# Patient Record
Sex: Female | Born: 1990 | Race: Black or African American | Hispanic: No | Marital: Single | Smoking: Current every day smoker
Health system: Southern US, Community
[De-identification: ages and names within clinical notes are randomized; demographics above are authoritative.]

## PROBLEM LIST (undated history)

## (undated) DIAGNOSIS — J45909 Unspecified asthma, uncomplicated: Secondary | ICD-10-CM

## (undated) HISTORY — PX: ANKLE FRACTURE SURGERY: SHX122

---

## 2016-07-28 ENCOUNTER — Emergency Department
Admission: EM | Admit: 2016-07-28 | Discharge: 2016-07-28 | Disposition: A | Discharge status: Home / Self Care | Payer: Medicaid Other | Attending: Emergency Medicine | Admitting: Emergency Medicine

## 2016-07-28 ENCOUNTER — Encounter: Payer: Self-pay | Admitting: Emergency Medicine

## 2016-07-28 DIAGNOSIS — N764 Abscess of vulva: Secondary | ICD-10-CM | POA: Insufficient documentation

## 2016-07-28 DIAGNOSIS — F17200 Nicotine dependence, unspecified, uncomplicated: Secondary | ICD-10-CM | POA: Diagnosis not present

## 2016-07-28 DIAGNOSIS — L0291 Cutaneous abscess, unspecified: Secondary | ICD-10-CM

## 2016-07-28 DIAGNOSIS — J45909 Unspecified asthma, uncomplicated: Secondary | ICD-10-CM | POA: Insufficient documentation

## 2016-07-28 DIAGNOSIS — N762 Acute vulvitis: Secondary | ICD-10-CM

## 2016-07-28 HISTORY — DX: Unspecified asthma, uncomplicated: J45.909

## 2016-07-28 MED ORDER — HYDROCODONE-ACETAMINOPHEN 5-325 MG PO TABS
1.0000 | ORAL_TABLET | Freq: Once | ORAL | Status: AC
  Administered 2016-07-28: 1 via ORAL
  Filled 2016-07-28: qty 1

## 2016-07-28 MED ORDER — HYDROCODONE-ACETAMINOPHEN 5-325 MG PO TABS: 1.0000 | ORAL_TABLET | Freq: Four times a day (QID) | ORAL | 0 refills | Status: AC | PRN

## 2016-07-28 MED ORDER — SULFAMETHOXAZOLE-TRIMETHOPRIM 800-160 MG PO TABS: 1.0000 | ORAL_TABLET | Freq: Two times a day (BID) | ORAL | 0 refills | Status: DC

## 2016-07-28 NOTE — ED Triage Notes (Signed)
Pt to ED with c/o of abscess in vaginal area. Pt states has had them before but this time it is much worse.

## 2016-07-28 NOTE — ED Provider Notes (Signed)
[facility] Emergency Department Provider Note  ____________________________________________  Time seen: Approximately 2:56 PM  I have reviewed the triage vital signs and the nursing notes.   HISTORY  Chief Complaint Abscess    HPI Janice Ramsey is a 25 y.o. female , NAD, presents to the emergency department with three-day history of vaginal swelling. Patient states she noted a bump about the superior portion of her vaginal vault. States that his consistently grown in size and become more painful over the last 48 hours. Denies any oozing, weeping or bleeding. Area is not red but is significantly tender to palpation. States she has had other abscesses about her vulva but none inside the vagina. States she is sexually active with female partners only, but does use sexual toys during intercourse. States the toys are cleaned properly after use. No trauma or injury to the vagina. No vaginal discharge, pelvic pain, saddle paresthesias nor loss of bowel or bladder control. Denies dysuria, hematuria, abdominal pain, nausea or vomiting. Has had no fevers, chills, body aches. Denies chest pain or shortness of breath.   Past Medical History:  Diagnosis Date  . Asthma     There are no active problems to display for this patient.   History reviewed. No pertinent surgical history.  Prior to Admission medications   Medication Sig Start Date End Date Taking? Authorizing Provider  HYDROcodone-acetaminophen (NORCO) 5-325 MG tablet Take 1 tablet by mouth every 6 (six) hours as needed for severe pain. 07/28/16   Ginevra Tacker L Breeona Waid, PA-C  sulfamethoxazole-trimethoprim (BACTRIM DS,SEPTRA DS) 800-160 MG tablet Take 1 tablet by mouth 2 (two) times daily. 07/28/16   Sharissa Brierley L Malijah Lietz, PA-C    Allergies Review of patient's allergies indicates no known allergies.  History reviewed. No pertinent family history.  Social History Social History  Substance Use Topics  . Smoking status:  Current Every Day Smoker  . Smokeless tobacco: Never Used  . Alcohol use No     Review of Systems  Constitutional: No fever/chills Cardiovascular: No chest pain. Respiratory: No shortness of breath.  Gastrointestinal: No abdominal pain.  No nausea, vomiting.   Genitourinary: Negative for dysuria, hematuria, vaginal discharge. No urinary hesitancy, urgency or increased frequency. Musculoskeletal: Negative for back pain.  Skin: Positive abscess vagina. Negative for rash, skin sores, oozing, weeping, bleeding. Neurological: Negative for numbness, weakness, tingling. No saddle paresthesias or loss of bowel or bladder control. 10-point ROS otherwise negative.  ____________________________________________   PHYSICAL EXAM:  VITAL SIGNS: ED Triage Vitals [07/28/16 1243]  Enc Vitals Group     BP 123/84     Pulse Rate 82     Resp 16     Temp 98.3 F (36.8 C)     Temp Source Oral     SpO2 100 %     Weight 283 lb (128.4 kg)     Height 5\' 8"  (1.727 m)     Head Circumference      Peak Flow      Pain Score 10     Pain Loc      Pain Edu?      Excl. in GC?      Constitutional: Alert and oriented. Well appearing and in no acute distress. Eyes: Conjunctivae are normal. Head: Atraumatic. Hematological/Lymphatic/Immunilogical: No inguinal lymphadenopathy. Cardiovascular: Good peripheral circulation. Respiratory: Normal respiratory effort without tachypnea or retractions. Gastrointestinal: Soft and nontender without distention or guarding in all quadrants. Genitourinary:  Clitoris and clitoral hood are engorged to ~1.5-2cm in width with significant  tenderness to palpation. No active oozing, weeping or bleeding. Labia without swelling, skin sores or erythema. Vaginal vault without evidence of discharge. Neurologic:  Normal speech and language. No gross focal neurologic deficits are appreciated.  Skin:  Skin is warm, dry and intact. Psychiatric: Mood and affect are normal. Speech and  behavior are normal. Patient exhibits appropriate insight and judgement.   ____________________________________________   LABS  None ____________________________________________  EKG  None ____________________________________________  RADIOLOGY  None ____________________________________________    PROCEDURES  Procedure(s) performed: None   Procedures   Medications  HYDROcodone-acetaminophen (NORCO/VICODIN) 5-325 MG per tablet 1 tablet (1 tablet Oral Given 07/28/16 1532)     ____________________________________________   INITIAL IMPRESSION / ASSESSMENT AND PLAN / ED COURSE  Pertinent labs & imaging results that were available during my care of the patient were reviewed by me and considered in my medical decision making (see chart for details).  Clinical Course  Comment By Time  I spoke with Dr. Jean Rosenthal, OBGYN on call, in regards to the patient's history and physical exam. He suggests starting the patient on Bactrim DS to hopefully clear the infection and avoid an I&D. Patient will follow up with him in office Monday or Tuesday or return to the ED sooner if any worsening or new symptoms. All information was relayed to the patient and she agrees with the plan of care.  [name], PA-C 10/13 1530    Patient's diagnosis is consistent with Clitoral abscess. Patient will be discharged home with prescriptions for Bactrim DS and Norco to take as directed. Patient is to follow up with Dr. Jean Rosenthal an OB/GYN on Monday for further evaluation and treatment. Patient is to return to the emergency department if there is any worsening or onset of new symptoms.   ____________________________________________  FINAL CLINICAL IMPRESSION(S) / ED DIAGNOSES  Final diagnoses:  Inflammation of clitoris  Abscess      NEW MEDICATIONS STARTED DURING THIS VISIT:  Discharge Medication List as of 07/28/2016  3:45 PM    START taking these medications   Details   HYDROcodone-acetaminophen (NORCO) 5-325 MG tablet Take 1 tablet by mouth every 6 (six) hours as needed for severe pain., Starting Fri 07/28/2016, Print    sulfamethoxazole-trimethoprim (BACTRIM DS,SEPTRA DS) 800-160 MG tablet Take 1 tablet by mouth 2 (two) times daily., Starting Fri 07/28/2016, Print             Ernestene Kiel [place], PA-C 07/28/16 1605    Jennye Moccasin, MD 07/28/16 [phone]

## 2016-07-28 NOTE — ED Notes (Signed)
Pt has an abscess on the inside of vagina, pt noticed abscess 3 days ago, pt denies any other symptoms

## 2017-04-25 ENCOUNTER — Emergency Department
Admission: EM | Admit: 2017-04-25 | Discharge: 2017-04-25 | Disposition: A | Discharge status: Home / Self Care | Payer: Medicaid Other | Attending: Emergency Medicine | Admitting: Emergency Medicine

## 2017-04-25 ENCOUNTER — Encounter: Payer: Self-pay | Admitting: Emergency Medicine

## 2017-04-25 DIAGNOSIS — B3731 Acute candidiasis of vulva and vagina: Secondary | ICD-10-CM

## 2017-04-25 DIAGNOSIS — N760 Acute vaginitis: Secondary | ICD-10-CM | POA: Insufficient documentation

## 2017-04-25 DIAGNOSIS — F17200 Nicotine dependence, unspecified, uncomplicated: Secondary | ICD-10-CM | POA: Insufficient documentation

## 2017-04-25 DIAGNOSIS — J45909 Unspecified asthma, uncomplicated: Secondary | ICD-10-CM | POA: Diagnosis not present

## 2017-04-25 DIAGNOSIS — R102 Pelvic and perineal pain: Secondary | ICD-10-CM | POA: Diagnosis present

## 2017-04-25 DIAGNOSIS — B373 Candidiasis of vulva and vagina: Secondary | ICD-10-CM

## 2017-04-25 MED ORDER — CLOTRIMAZOLE 2 % VA CREA: 1.0000 | TOPICAL_CREAM | Freq: Every day | VAGINAL | 0 refills | Status: AC

## 2017-04-25 MED ORDER — FLUCONAZOLE 100 MG PO TABS
150.0000 mg | ORAL_TABLET | Freq: Once | ORAL | Status: AC
  Administered 2017-04-25: 150 mg via ORAL
  Filled 2017-04-25: qty 1

## 2017-04-25 MED ORDER — CLOTRIMAZOLE 2 % VA CREA: 1.0000 | TOPICAL_CREAM | Freq: Once | VAGINAL | Status: DC

## 2017-04-25 NOTE — ED Triage Notes (Signed)
Patient to ER for c/o vaginal pain and itching. Patient states she thought she was getting UTI, took antibiotic. Patient states afterwards, developed itching and pain to vaginal opening. Patient denies any discharge that she is aware of. Denies any fevers.

## 2017-04-25 NOTE — ED Provider Notes (Signed)
[facility] Emergency Department Provider Note   ____________________________________________   First MD Initiated Contact with Patient 04/25/17 [phone]     (approximate)  I have reviewed the triage vital signs and the nursing notes.   HISTORY  Chief Complaint Vaginal Itching    HPI Janice Ramsey is a 26 y.o. female who presents to the ED from home with a chief of vaginal pain and itching.Symptoms 2 days. Thought she was getting a UTI so took some leftover antibiotic. Subsequently developed itching and pain to her vagina. Denies associated vaginal bleeding, discharge or STD concerns. Does not have sex with men and reports sexual interactions with women does not involve touching her genitals. Denies fever, chills, chest pain, shortness of breath, abdominal pain, pelvic pain, nausea, vomiting, dysuria. Denies recent travel or trauma.   Past Medical History:  Diagnosis Date  . Asthma     There are no active problems to display for this patient.   History reviewed. No pertinent surgical history.  Prior to Admission medications   Medication Sig Start Date End Date Taking? Authorizing Provider  albuterol (PROVENTIL HFA;VENTOLIN HFA) 108 (90 Base) MCG/ACT inhaler Inhale 5 puffs into the lungs every 6 (six) hours as needed for wheezing. 05/14/16 05/14/17 Yes [provider]  clotrimazole (GYNE-LOTRIMIN 3) 2 % vaginal cream Place 1 Applicatorful vaginally at bedtime. 04/25/17 04/30/17  Irean HongSung, Hani Patnode J, MD  HYDROcodone-acetaminophen (NORCO) 5-325 MG tablet Take 1 tablet by mouth every 6 (six) hours as needed for severe pain. Patient not taking: Reported on 04/25/2017 07/28/16   Hagler, Jami L, PA-C    Allergies Patient has no known allergies.  No family history on file.  Social History Social History  Substance Use Topics  . Smoking status: Current Every Day Smoker  . Smokeless tobacco: Never Used  . Alcohol use No    Review of  Systems  Constitutional: No fever/chills. Eyes: No visual changes. ENT: No sore throat. Cardiovascular: Denies chest pain. Respiratory: Denies shortness of breath. Gastrointestinal: No abdominal pain.  No nausea, no vomiting.  No diarrhea.  No constipation. Genitourinary: Positive for vaginal itching and pain. Negative for dysuria. Musculoskeletal: Negative for back pain. Skin: Negative for rash. Neurological: Negative for headaches, focal weakness or numbness.   ____________________________________________   PHYSICAL EXAM:  VITAL SIGNS: ED Triage Vitals  Enc Vitals Group     BP 04/25/17 0313 120/78     Pulse Rate 04/25/17 0313 91     Resp 04/25/17 0313 20     Temp 04/25/17 0313 98.3 F (36.8 C)     Temp Source 04/25/17 0313 Oral     SpO2 04/25/17 0313 97 %     Weight 04/25/17 0314 285 lb (129.3 kg)     Height 04/25/17 0314 5\' 8"  (1.727 m)     Head Circumference --      Peak Flow --      Pain Score 04/25/17 0312 10     Pain Loc --      Pain Edu? --      Excl. in GC? --     Constitutional: Alert and oriented. Well appearing and in no acute distress. Eyes: Conjunctivae are normal. PERRL. EOMI. Head: Atraumatic. Nose: No congestion/rhinnorhea. Mouth/Throat: Mucous membranes are moist.  Oropharynx non-erythematous. Neck: No stridor.   Cardiovascular: Normal rate, regular rhythm. Grossly normal heart sounds.  Good peripheral circulation. Respiratory: Normal respiratory effort.  No retractions. Lungs CTAB. Gastrointestinal: Obese. Soft and nontender to light or deep palpation. No  distention. No abdominal bruits. No CVA tenderness. Genitourinary: External exam within normal limits without rashes, lesions or vesicles. Outer vaginal labia spread to reveal cheesy discharge. No swelling, bleeding or abrasions. Musculoskeletal: No lower extremity tenderness nor edema.  No joint effusions. Neurologic:  Normal speech and language. No gross focal neurologic deficits are  appreciated. No gait instability. Skin:  Skin is warm, dry and intact. No rash noted. Psychiatric: Mood and affect are normal. Speech and behavior are normal.  ____________________________________________   LABS (all labs ordered are listed, but only abnormal results are displayed)  Labs Reviewed  URINALYSIS, COMPLETE (UACMP) WITH MICROSCOPIC   ____________________________________________  EKG  None ____________________________________________  RADIOLOGY  No results found.  ____________________________________________   PROCEDURES  Procedure(s) performed: None  Procedures  Critical Care performed: No  ____________________________________________   INITIAL IMPRESSION / ASSESSMENT AND PLAN / ED COURSE  Pertinent labs & imaging results that were available during my care of the patient were reviewed by me and considered in my medical decision making (see chart for details).  26 year old female who presents with yeast vaginitis. Will administer Diflucan and also apply vaginal cream for comfort. Strict return precautions given. Patient verbalizes understanding and agrees with plan of care.      ____________________________________________   FINAL CLINICAL IMPRESSION(S) / ED DIAGNOSES  Final diagnoses:  Yeast vaginitis      NEW MEDICATIONS STARTED DURING THIS VISIT:  New Prescriptions   CLOTRIMAZOLE (GYNE-LOTRIMIN 3) 2 % VAGINAL CREAM    Place 1 Applicatorful vaginally at bedtime.     Note:  This document was prepared using Dragon voice recognition software and may include unintentional dictation errors.    Irean Hong, MD 04/25/17 [phone]

## 2017-04-25 NOTE — ED Notes (Signed)
Upon assessment pt reports itching in "vaginal area."

## 2017-04-25 NOTE — Discharge Instructions (Signed)
1. Apply vaginal cream nightly 5 nights. 2. Use cool compresses as needed for discomfort. 3. Return to the ER for worsening symptoms, persistent vomiting, difficulty breathing or other concerns.

## 2017-04-26 ENCOUNTER — Telehealth: Payer: Self-pay | Admitting: Emergency Medicine

## 2017-04-26 NOTE — Telephone Encounter (Signed)
Patient called because she continues to have vaginal irritation, and had some blood when she wiped today.  I advised her to call her pcp for advice on further treatments.  I told her she could always return here or go to urgent care if her doctor could not see her.

## 2017-08-06 ENCOUNTER — Encounter: Payer: Self-pay | Admitting: Emergency Medicine

## 2017-08-06 ENCOUNTER — Emergency Department
Admission: EM | Admit: 2017-08-06 | Discharge: 2017-08-06 | Disposition: A | Discharge status: Home / Self Care | Payer: Self-pay | Attending: Emergency Medicine | Admitting: Emergency Medicine

## 2017-08-06 DIAGNOSIS — F17200 Nicotine dependence, unspecified, uncomplicated: Secondary | ICD-10-CM | POA: Insufficient documentation

## 2017-08-06 DIAGNOSIS — N764 Abscess of vulva: Secondary | ICD-10-CM | POA: Insufficient documentation

## 2017-08-06 DIAGNOSIS — N762 Acute vulvitis: Secondary | ICD-10-CM

## 2017-08-06 DIAGNOSIS — J45909 Unspecified asthma, uncomplicated: Secondary | ICD-10-CM | POA: Insufficient documentation

## 2017-08-06 MED ORDER — SULFAMETHOXAZOLE-TRIMETHOPRIM 800-160 MG PO TABS: 1.0000 | ORAL_TABLET | Freq: Two times a day (BID) | ORAL | 0 refills | Status: AC

## 2017-08-06 MED ORDER — SULFAMETHOXAZOLE-TRIMETHOPRIM 800-160 MG PO TABS
1.0000 | ORAL_TABLET | Freq: Once | ORAL | Status: AC
  Administered 2017-08-06: 1 via ORAL
  Filled 2017-08-06: qty 1

## 2017-08-06 NOTE — ED Provider Notes (Signed)
[facility] Emergency Department Provider Note  ____________________________________________  Time seen: Approximately 9:36 PM  I have reviewed the triage vital signs and the nursing notes.   HISTORY  Chief Complaint Abscess    HPI Janice Ramsey is a 26 y.o. female who presents emergency department complaining of a "abscess" to the left labia. Patient reports that she gets "bumps" in the groin region frequently. Typically these are mildly painful, resolve on their own. The one today, has only worsened. She denies any drainage. She reports pain, swelling. No other complaints at this time. No abdominal pain, vaginal bleeding or discharge, dysuria, polyuria, hematuria. No medications prior to arrival. No other complaints at this time.   Past Medical History:  Diagnosis Date  . Asthma     There are no active problems to display for this patient.   History reviewed. No pertinent surgical history.  Prior to Admission medications   Medication Sig Start Date End Date Taking? Authorizing Provider  albuterol (PROVENTIL HFA;VENTOLIN HFA) 108 (90 Base) MCG/ACT inhaler Inhale 5 puffs into the lungs every 6 (six) hours as needed for wheezing. 05/14/16 05/14/17  [provider]  HYDROcodone-acetaminophen (NORCO) 5-325 MG tablet Take 1 tablet by mouth every 6 (six) hours as needed for severe pain. Patient not taking: Reported on 04/25/2017 07/28/16   Hagler, Jami L, PA-C  sulfamethoxazole-trimethoprim (BACTRIM DS,SEPTRA DS) 800-160 MG tablet Take 1 tablet by mouth 2 (two) times daily. 08/06/17   Cuthriell, Delorise RoyalsJonathan D, PA-C    Allergies Patient has no known allergies.  History reviewed. No pertinent family history.  Social History Social History  Substance Use Topics  . Smoking status: Current Every Day Smoker  . Smokeless tobacco: Never Used  . Alcohol use No     Review of Systems  Constitutional: No fever/chills Cardiovascular: no chest  pain. Respiratory: no cough. No SOB. Gastrointestinal: No abdominal pain.  No nausea, no vomiting.  No diarrhea.  No constipation. Genitourinary: Negative for dysuria. No hematuria Musculoskeletal: Negative for musculoskeletal pain. Skin: Negative for rash, abrasions, lacerations, ecchymosis. Positive for left labial abscess. Neurological: Negative for headaches, focal weakness or numbness. 10-point ROS otherwise negative.  ____________________________________________   PHYSICAL EXAM:  VITAL SIGNS: ED Triage Vitals  Enc Vitals Group     BP 08/06/17 2026 (!) 141/84     Pulse Rate 08/06/17 2026 90     Resp 08/06/17 2026 16     Temp 08/06/17 2026 98.9 F (37.2 C)     Temp src --      SpO2 08/06/17 2026 100 %     Weight 08/06/17 2023 285 lb (129.3 kg)     Height --      Head Circumference --      Peak Flow --      Pain Score --      Pain Loc --      Pain Edu? --      Excl. in GC? --      Constitutional: Alert and oriented. Well appearing and in no acute distress. Eyes: Conjunctivae are normal. PERRL. EOMI. Head: Atraumatic. Neck: No stridor.    Cardiovascular: Normal rate, regular rhythm. Normal S1 and S2.  Good peripheral circulation. Respiratory: Normal respiratory effort without tachypnea or retractions. Lungs CTAB. Good air entry to the bases with no decreased or absent breath sounds. Gastrointestinal: Bowel sounds 4 quadrants. Soft and nontender to palpation. No guarding or rigidity. No palpable masses. No distention. No CVA tenderness. Genitourinary: Visualization of the external genitalia  reveals erythema and edema to the left labia. Palpation reveals firmness, extreme tenderness. No fluctuance or induration. Musculoskeletal: Full range of motion to all extremities. No gross deformities appreciated. Neurologic:  Normal speech and language. No gross focal neurologic deficits are appreciated.  Skin:  Skin is warm, dry and intact. No rash noted. Psychiatric: Mood and  affect are normal. Speech and behavior are normal. Patient exhibits appropriate insight and judgement.  External genitalia exam is performed with female RN chaperone ____________________________________________   LABS (all labs ordered are listed, but only abnormal results are displayed)  Labs Reviewed - No data to display ____________________________________________  EKG   ____________________________________________  RADIOLOGY   No results found.  ____________________________________________    PROCEDURES  Procedure(s) performed:    Procedures    Medications  sulfamethoxazole-trimethoprim (BACTRIM DS,SEPTRA DS) 800-160 MG per tablet 1 tablet (not administered)     ____________________________________________   INITIAL IMPRESSION / ASSESSMENT AND PLAN / ED COURSE  Pertinent labs & imaging results that were available during my care of the patient were reviewed by me and considered in my medical decision making (see chart for details).  Review of the [place] CSRS was performed in accordance of the [facility] prior to dispensing any controlled drugs.     Patient's diagnosis is consistent with cellulitis to left labia. Differential included abscess versus cellulitis. Exam reveals no fluctuance or induration concerning for underlying abscess. No indication for labs, imaging, incision and drainage. Patient is treated with first dose of Bactrim in the emergency department.. Patient will be discharged home with prescriptions for Bactrim. Patient is to follow up with primary care or OB/GYN as needed or otherwise directed. Patient is given ED precautions to return to the ED for any worsening or new symptoms.     ____________________________________________  FINAL CLINICAL IMPRESSION(S) / ED DIAGNOSES  Final diagnoses:  Cellulitis of labia      NEW MEDICATIONS STARTED DURING THIS VISIT:  New Prescriptions   SULFAMETHOXAZOLE-TRIMETHOPRIM (BACTRIM DS,SEPTRA DS) 800-160  MG TABLET    Take 1 tablet by mouth 2 (two) times daily.        This chart was dictated using voice recognition software/Dragon. Despite best efforts to proofread, errors can occur which can change the meaning. Any change was purely unintentional.    Racheal Patches, PA-C 08/06/17 2144    Dionne Bucy, MD 08/06/17 2332

## 2017-08-06 NOTE — ED Triage Notes (Signed)
Pt c/o left sided raised area to the labia x3 days. Pt denies drainage but reports swelling, redness and 10/10 pain in area, worse when sitting down.

## 2017-08-06 NOTE — ED Notes (Signed)
See provider note for assessment

## 2017-11-19 ENCOUNTER — Emergency Department
Admission: EM | Admit: 2017-11-19 | Discharge: 2017-11-19 | Disposition: A | Discharge status: Home / Self Care | Payer: Self-pay | Attending: Emergency Medicine | Admitting: Emergency Medicine

## 2017-11-19 ENCOUNTER — Encounter: Payer: Self-pay | Admitting: Emergency Medicine

## 2017-11-19 DIAGNOSIS — J45909 Unspecified asthma, uncomplicated: Secondary | ICD-10-CM | POA: Insufficient documentation

## 2017-11-19 DIAGNOSIS — J111 Influenza due to unidentified influenza virus with other respiratory manifestations: Secondary | ICD-10-CM | POA: Insufficient documentation

## 2017-11-19 DIAGNOSIS — Z79899 Other long term (current) drug therapy: Secondary | ICD-10-CM | POA: Insufficient documentation

## 2017-11-19 DIAGNOSIS — F17200 Nicotine dependence, unspecified, uncomplicated: Secondary | ICD-10-CM | POA: Insufficient documentation

## 2017-11-19 LAB — INFLUENZA PANEL BY PCR (TYPE A & B)
INFLBPCR: NEGATIVE
Influenza A By PCR: POSITIVE — AB

## 2017-11-19 MED ORDER — ALBUTEROL SULFATE HFA 108 (90 BASE) MCG/ACT IN AERS: 2.0000 | INHALATION_SPRAY | Freq: Four times a day (QID) | RESPIRATORY_TRACT | 0 refills | Status: AC | PRN

## 2017-11-19 MED ORDER — OSELTAMIVIR PHOSPHATE 75 MG PO CAPS: 75.0000 mg | ORAL_CAPSULE | Freq: Two times a day (BID) | ORAL | 0 refills | Status: AC

## 2017-11-19 MED ORDER — ALBUTEROL SULFATE (2.5 MG/3ML) 0.083% IN NEBU: 2.5000 mg | INHALATION_SOLUTION | Freq: Four times a day (QID) | RESPIRATORY_TRACT | 12 refills | Status: AC | PRN

## 2017-11-19 MED ORDER — IPRATROPIUM-ALBUTEROL 0.5-2.5 (3) MG/3ML IN SOLN
3.0000 mL | Freq: Once | RESPIRATORY_TRACT | Status: AC
  Administered 2017-11-19: 3 mL via RESPIRATORY_TRACT
  Filled 2017-11-19: qty 3

## 2017-11-19 NOTE — ED Triage Notes (Signed)
Presents with body aches,cough and fever for couple of days

## 2017-11-19 NOTE — ED Provider Notes (Signed)
[facility] Emergency Department Provider Note  ____________________________________________  Time seen: Approximately 7:31 PM  I have reviewed the triage vital signs and the nursing notes.   HISTORY  Chief Complaint Generalized Body Aches    HPI Janice Ramsey is a 27 y.o. female presents emergency department for evaluation of body aches, nonproductive cough, shortness of breath for 2 days.  Patient has a history of asthma and has been using her albuterol inhaler.  Her inhaler is almost out.  She is unsure of fever.  No sick contacts. She is eating and drinking well. No headache, chest pain, nausea, vomiting, abdominal pain.  Past Medical History:  Diagnosis Date  . Asthma     There are no active problems to display for this patient.   History reviewed. No pertinent surgical history.  Prior to Admission medications   Medication Sig Start Date End Date Taking? Authorizing Provider  albuterol (PROVENTIL HFA;VENTOLIN HFA) 108 (90 Base) MCG/ACT inhaler Inhale 2 puffs into the lungs every 6 (six) hours as needed for wheezing or shortness of breath. 11/19/17   Enid DerryWagner, Laysa Kimmey, PA-C  albuterol (PROVENTIL) (2.5 MG/3ML) 0.083% nebulizer solution Take 3 mLs (2.5 mg total) by nebulization every 6 (six) hours as needed for wheezing or shortness of breath. 11/19/17   Enid DerryWagner, Arneta Mahmood, PA-C  HYDROcodone-acetaminophen (NORCO) 5-325 MG tablet Take 1 tablet by mouth every 6 (six) hours as needed for severe pain. Patient not taking: Reported on 04/25/2017 07/28/16   Hagler, Jami L, PA-C  oseltamivir (TAMIFLU) 75 MG capsule Take 1 capsule (75 mg total) by mouth 2 (two) times daily for 5 days. 11/19/17 11/24/17  Enid DerryWagner, Srija Southard, PA-C  sulfamethoxazole-trimethoprim (BACTRIM DS,SEPTRA DS) 800-160 MG tablet Take 1 tablet by mouth 2 (two) times daily. 08/06/17   Cuthriell, Delorise RoyalsJonathan D, PA-C    Allergies Patient has no known allergies.  No family history on file.  Social History Social  History   Tobacco Use  . Smoking status: Current Every Day Smoker  . Smokeless tobacco: Never Used  Substance Use Topics  . Alcohol use: No  . Drug use: No     Review of Systems  Constitutional: No fever/chills Cardiovascular: No chest pain. Gastrointestinal: No abdominal pain.  No nausea, no vomiting.  Musculoskeletal: Positive for body aches. Skin: Negative for rash, abrasions, lacerations, ecchymosis.   ____________________________________________   PHYSICAL EXAM:  VITAL SIGNS: ED Triage Vitals  Enc Vitals Group     BP 11/19/17 1444 104/62     Pulse Rate 11/19/17 1444 88     Resp 11/19/17 1444 18     Temp 11/19/17 1444 99 F (37.2 C)     Temp Source 11/19/17 1444 Oral     SpO2 11/19/17 1444 95 %     Weight 11/19/17 1439 265 lb (120.2 kg)     Height 11/19/17 1439 5\' 8"  (1.727 m)     Head Circumference --      Peak Flow --      Pain Score 11/19/17 1439 10     Pain Loc --      Pain Edu? --      Excl. in GC? --      Constitutional: Alert and oriented. Well appearing and in no acute distress. Eyes: Conjunctivae are normal. PERRL. EOMI. Head: Atraumatic. ENT:      Ears: Tympanic membranes are pearly.      Nose: No congestion/rhinnorhea.      Mouth/Throat: Mucous membranes are moist.  Neck: No stridor.   Cardiovascular:  Normal rate, regular rhythm.  Good peripheral circulation. Respiratory: Normal respiratory effort without tachypnea or retractions. Lungs CTAB. Good air entry to the bases with no decreased or absent breath sounds. Gastrointestinal: Bowel sounds 4 quadrants. Soft and nontender to palpation. No guarding or rigidity. No palpable masses. No distention.  Musculoskeletal: Full range of motion to all extremities. No gross deformities appreciated. Neurologic:  Normal speech and language. No gross focal neurologic deficits are appreciated.  Skin:  Skin is warm, dry and intact. No rash noted. Psychiatric: Mood and affect are normal. Speech and behavior  are normal. Patient exhibits appropriate insight and judgement.   ____________________________________________   LABS (all labs ordered are listed, but only abnormal results are displayed)  Labs Reviewed  INFLUENZA PANEL BY PCR (TYPE A & B) - Abnormal; Notable for the following components:      Result Value   Influenza A By PCR POSITIVE (*)    All other components within normal limits   ____________________________________________  EKG   ____________________________________________  RADIOLOGY   No results found.  ____________________________________________    PROCEDURES  Procedure(s) performed:    Procedures    Medications  ipratropium-albuterol (DUONEB) 0.5-2.5 (3) MG/3ML nebulizer solution 3 mL (3 mLs Nebulization Given 11/19/17 1628)     ____________________________________________   INITIAL IMPRESSION / ASSESSMENT AND PLAN / ED COURSE  Pertinent labs & imaging results that were available during my care of the patient were reviewed by me and considered in my medical decision making (see chart for details).  Review of the [place] CSRS was performed in accordance of the [facility] prior to dispensing any controlled drugs.     Patient's diagnosis is consistent with influenza A.  Vital signs and exam are reassuring.  Shortness of breath resolved with DuoNeb.  Patient will be discharged home with prescriptions for Tamiflu, albuterol inhaler, albuterol nebulizer. Patient is to follow up with  PCP as directed. Patient is given ED precautions to return to the ED for any worsening or new symptoms.     ____________________________________________  FINAL CLINICAL IMPRESSION(S) / ED DIAGNOSES  Final diagnoses:  Influenza  Asthma, unspecified asthma severity, unspecified whether complicated, unspecified whether persistent      NEW MEDICATIONS STARTED DURING THIS VISIT:  ED Discharge Orders        Ordered    DME Nebulizer machine     11/19/17 1649    albuterol  (PROVENTIL HFA;VENTOLIN HFA) 108 (90 Base) MCG/ACT inhaler  Every 6 hours PRN     11/19/17 1649    albuterol (PROVENTIL) (2.5 MG/3ML) 0.083% nebulizer solution  Every 6 hours PRN     11/19/17 1649    oseltamivir (TAMIFLU) 75 MG capsule  2 times daily     11/19/17 1649          This chart was dictated using voice recognition software/Dragon. Despite best efforts to proofread, errors can occur which can change the meaning. Any change was purely unintentional.    Enid Derry, PA-C 11/19/17 1952    Emily Filbert, MD 11/20/17 [phone]

## 2018-10-24 ENCOUNTER — Encounter (HOSPITAL_COMMUNITY): Payer: Self-pay | Admitting: Emergency Medicine

## 2018-10-24 ENCOUNTER — Other Ambulatory Visit: Payer: Self-pay

## 2018-10-24 ENCOUNTER — Emergency Department (HOSPITAL_COMMUNITY)
Admission: EM | Admit: 2018-10-24 | Discharge: 2018-10-24 | Disposition: A | Discharge status: Home / Self Care | Payer: Self-pay | Attending: Emergency Medicine | Admitting: Emergency Medicine

## 2018-10-24 ENCOUNTER — Emergency Department (HOSPITAL_COMMUNITY): Payer: Self-pay

## 2018-10-24 DIAGNOSIS — S62339A Displaced fracture of neck of unspecified metacarpal bone, initial encounter for closed fracture: Secondary | ICD-10-CM

## 2018-10-24 DIAGNOSIS — S62336A Displaced fracture of neck of fifth metacarpal bone, right hand, initial encounter for closed fracture: Secondary | ICD-10-CM | POA: Insufficient documentation

## 2018-10-24 DIAGNOSIS — Y999 Unspecified external cause status: Secondary | ICD-10-CM | POA: Insufficient documentation

## 2018-10-24 DIAGNOSIS — J45909 Unspecified asthma, uncomplicated: Secondary | ICD-10-CM | POA: Insufficient documentation

## 2018-10-24 DIAGNOSIS — Z79899 Other long term (current) drug therapy: Secondary | ICD-10-CM | POA: Insufficient documentation

## 2018-10-24 DIAGNOSIS — F17200 Nicotine dependence, unspecified, uncomplicated: Secondary | ICD-10-CM | POA: Insufficient documentation

## 2018-10-24 DIAGNOSIS — Y9389 Activity, other specified: Secondary | ICD-10-CM | POA: Insufficient documentation

## 2018-10-24 DIAGNOSIS — W2209XA Striking against other stationary object, initial encounter: Secondary | ICD-10-CM | POA: Insufficient documentation

## 2018-10-24 DIAGNOSIS — Y929 Unspecified place or not applicable: Secondary | ICD-10-CM | POA: Insufficient documentation

## 2018-10-24 NOTE — ED Triage Notes (Signed)
Patient presents with a hand injury s/p punching a wall. Patient states she has had a lot going on, got upset and hit the wall where the stud is. Patient's anterior and posterior hand noted to be bruised.

## 2018-10-24 NOTE — ED Provider Notes (Signed)
[facility]-EMERGENCY DEPT Provider Note   CSN: [id number] Arrival date & time: 10/24/18  2016     History   Chief Complaint Chief Complaint  Patient presents with  . Hand Injury    HPI Janice Ramsey is a 28 y.o. female possible history of asthma who presents for evaluation of continued right hand pain after she punched a wall approximately 4 days ago.  Patient reports that she was angry and upset at things going on and so she punched a wall.  She reports that she has had pain and swelling to the ulnar aspect of her right hand since then.  Patient reports she can move the hand without any difficulty but does report worsening pain when trying to move the pinky.  Patient denies any numbness/weakness.  The history is provided by the patient.    Past Medical History:  Diagnosis Date  . Asthma     There are no active problems to display for this patient.   History reviewed. No pertinent surgical history.   OB History   No obstetric history on file.      Home Medications    Prior to Admission medications   Medication Sig Start Date End Date Taking? Authorizing Provider  albuterol (PROVENTIL HFA;VENTOLIN HFA) 108 (90 Base) MCG/ACT inhaler Inhale 2 puffs into the lungs every 6 (six) hours as needed for wheezing or shortness of breath. 11/19/17   Enid Derry, PA-C  albuterol (PROVENTIL) (2.5 MG/3ML) 0.083% nebulizer solution Take 3 mLs (2.5 mg total) by nebulization every 6 (six) hours as needed for wheezing or shortness of breath. 11/19/17   Enid Derry, PA-C  HYDROcodone-acetaminophen (NORCO) 5-325 MG tablet Take 1 tablet by mouth every 6 (six) hours as needed for severe pain. Patient not taking: Reported on 04/25/2017 07/28/16   Hagler, Jami L, PA-C  sulfamethoxazole-trimethoprim (BACTRIM DS,SEPTRA DS) 800-160 MG tablet Take 1 tablet by mouth 2 (two) times daily. 08/06/17   Cuthriell, Delorise Royals, PA-C    Family History No family history on  file.  Social History Social History   Tobacco Use  . Smoking status: Current Every Day Smoker  . Smokeless tobacco: Never Used  Substance Use Topics  . Alcohol use: No  . Drug use: No     Allergies   Patient has no known allergies.   Review of Systems Review of Systems  Musculoskeletal:       Hand pain  Neurological: Negative for weakness and numbness.     Physical Exam Updated Vital Signs BP (!) 126/94 (BP Location: Left Arm)   Pulse 92   Temp 98.5 F (36.9 C) (Oral)   Resp 18   SpO2 100%   Physical Exam Vitals signs and nursing note reviewed.  Constitutional:      Appearance: She is well-developed.  HENT:     Head: Normocephalic and atraumatic.  Eyes:     General: No scleral icterus.       Right eye: No discharge.        Left eye: No discharge.     Conjunctiva/sclera: Conjunctivae normal.  Cardiovascular:     Pulses:          Radial pulses are 2+ on the right side and 2+ on the left side.  Pulmonary:     Effort: Pulmonary effort is normal.  Musculoskeletal:     Comments: Tenderness palpation noted to the third, fourth, fifth metacarpal.  There is some overlying soft tissue swelling noted to the dorsal aspect as  well as ecchymosis noted to the fourth and fifth metacarpal.  No tenderness palpation to phalanxes of right hand.  Patient can flex and extend all 5 digits without any difficulty.  She can easily make a fist.  Flexion/tension of wrist intact any difficulty.  No snuffbox tenderness.  No tenderness palpation noted to left hand.  Skin:    General: Skin is warm and dry.     Capillary Refill: Capillary refill takes less than 2 seconds.     Comments: Good distal cap refill. RUE is not dusky in appearance or cool to touch.  Neurological:     Mental Status: She is alert.     Comments: Sensation intact along major nerve distributions of RUE  Psychiatric:        Speech: Speech normal.        Behavior: Behavior normal.      ED Treatments / Results   Labs (all labs ordered are listed, but only abnormal results are displayed) Labs Reviewed - No data to display  EKG None  Radiology Dg Hand Complete Right  Result Date: 10/24/2018 CLINICAL DATA:  Bruising, punched a wall EXAM: RIGHT HAND - COMPLETE 3+ VIEW COMPARISON:  None. FINDINGS: Acute fracture distal shaft of the fifth metacarpal with mild volar angulation of distal fracture fragment. No subluxation. No radiopaque foreign body. IMPRESSION: Acute mildly angulated distal fifth metacarpal fracture Electronically Signed   By: Jasmine PangKim  Fujinaga M.D.   On: 10/24/2018 21:09    Procedures Procedures (including critical care time)  Medications Ordered in ED Medications - No data to display   Initial Impression / Assessment and Plan / ED Course  I have reviewed the triage vital signs and the nursing notes.  Pertinent labs & imaging results that were available during my care of the patient were reviewed by me and considered in my medical decision making (see chart for details).     28 year old female who presents for evaluation of right hand pain status post punching a wall 4 days ago. Patient is afebrile, non-toxic appearing, sitting comfortably on examination table. Vital signs reviewed and stable.  Patient is neurovascularly intact.  On exam, tenderness palpation in the third, fourth, fifth metacarpals.  Concern for fracture versus dislocation.  X-ray ordered at triage.  X-ray reviewed.  There is a acute mildly angulated distal fifth metacarpal fracture consistent with boxer's fracture.  Discussed results with patient.  Will patient in an ulnar gutter splint.  Reevaluation after splint placement.  Patient with good distal sensation and cap refill.  Instructed patient to follow-up with referred hand doctor for further evaluation. At this time, patient exhibits no emergent life-threatening condition that require further evaluation in ED or admission. Patient had ample opportunity for  questions and discussion. All patient's questions were answered with full understanding. Strict return precautions discussed. Patient expresses understanding and agreement to plan.   Portions of this note were generated with Scientist, clinical (histocompatibility and immunogenetics)Dragon dictation software. Dictation errors may occur despite best attempts at proofreading.   Final Clinical Impressions(s) / ED Diagnoses   Final diagnoses:  Closed boxer's fracture, initial encounter    ED Discharge Orders    None       Rosana HoesLayden,  A, PA-C 10/24/18 2209    Charlynne PanderYao, David Hsienta, MD 10/24/18 2248

## 2018-10-24 NOTE — Discharge Instructions (Signed)
You can take Tylenol or Ibuprofen as directed for pain. You can alternate Tylenol and Ibuprofen every 4 hours. If you take Tylenol at 1pm, then you can take Ibuprofen at 5pm. Then you can take Tylenol again at 9pm.   Keep the arm elevated at home.  Do not get the splint wet.  As we discussed, you will need to follow-up with referred hand doctor for further evaluation.  Return the emergency department for any worsening pain, redness or swelling of the hand, fever, numbness/weakness or any other worsening or concerning symptoms.

## 2019-05-22 IMAGING — CR DG HAND COMPLETE 3+V*R*
3 series · 3 of 3 positions shown · non-contrast
Comparison: None.

CLINICAL DATA: Bruising, punched a wall

EXAM:
RIGHT HAND - COMPLETE 3+ VIEW

[x hand pa right]
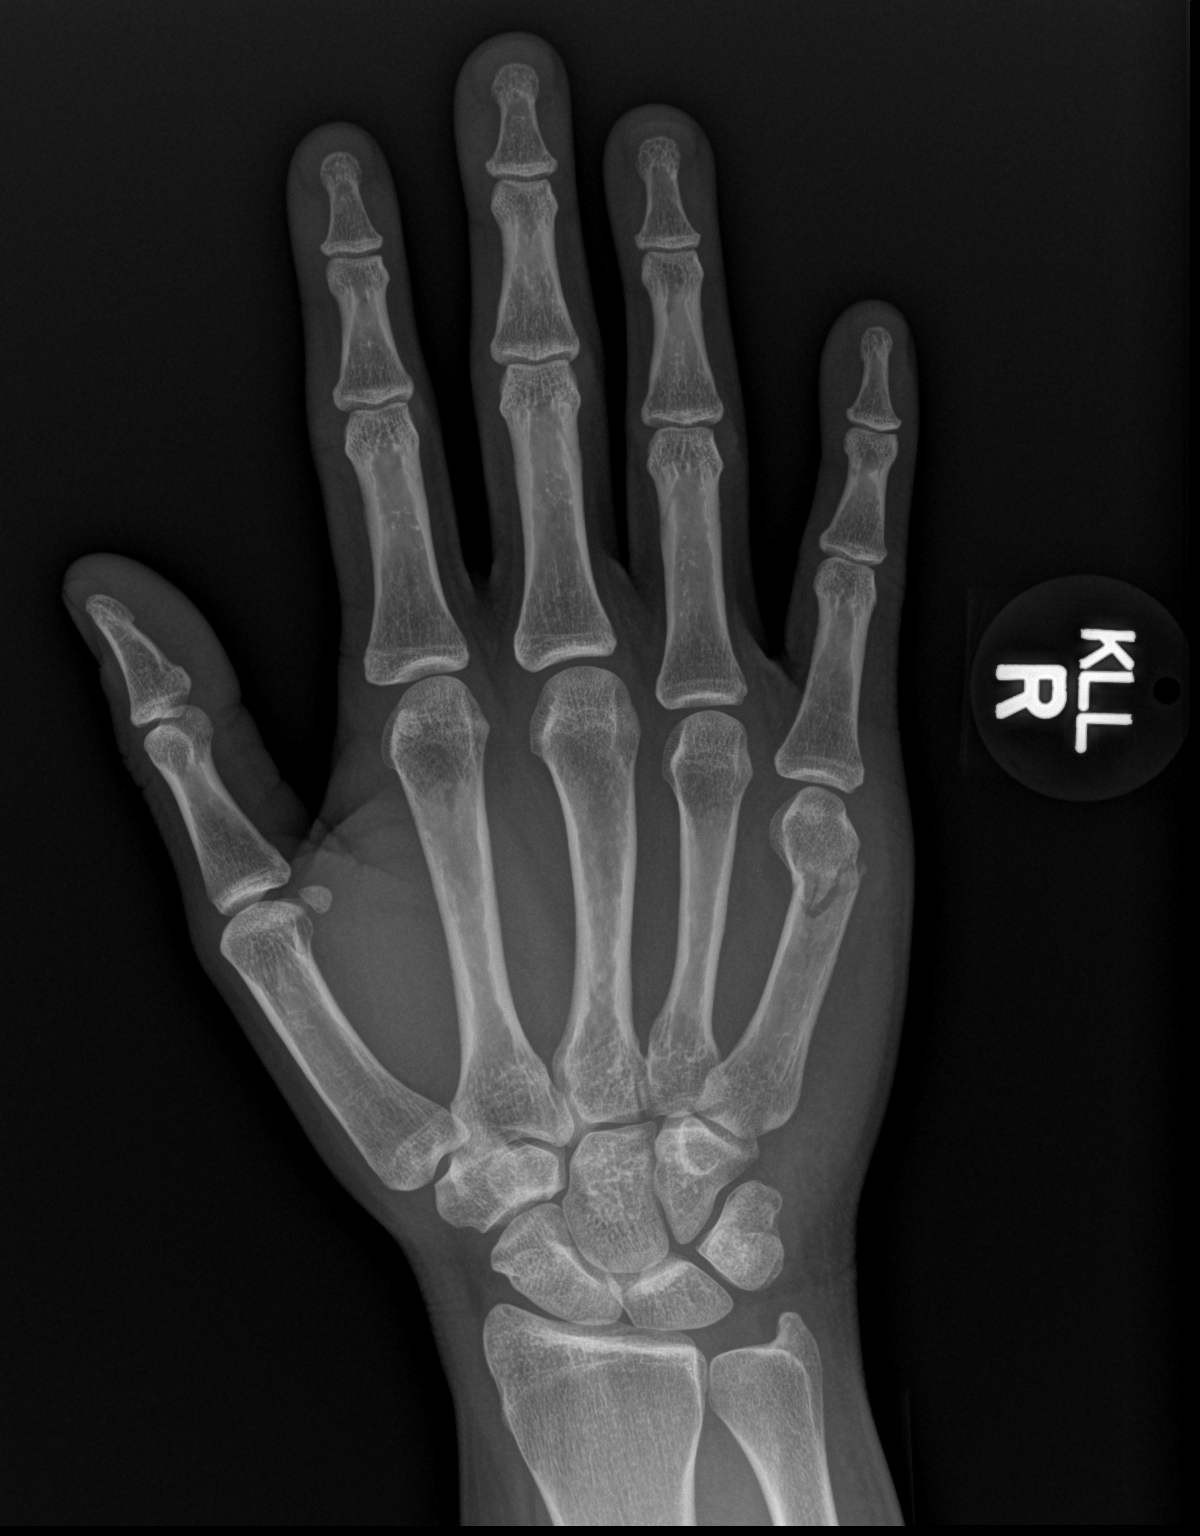

[x hand obl right]
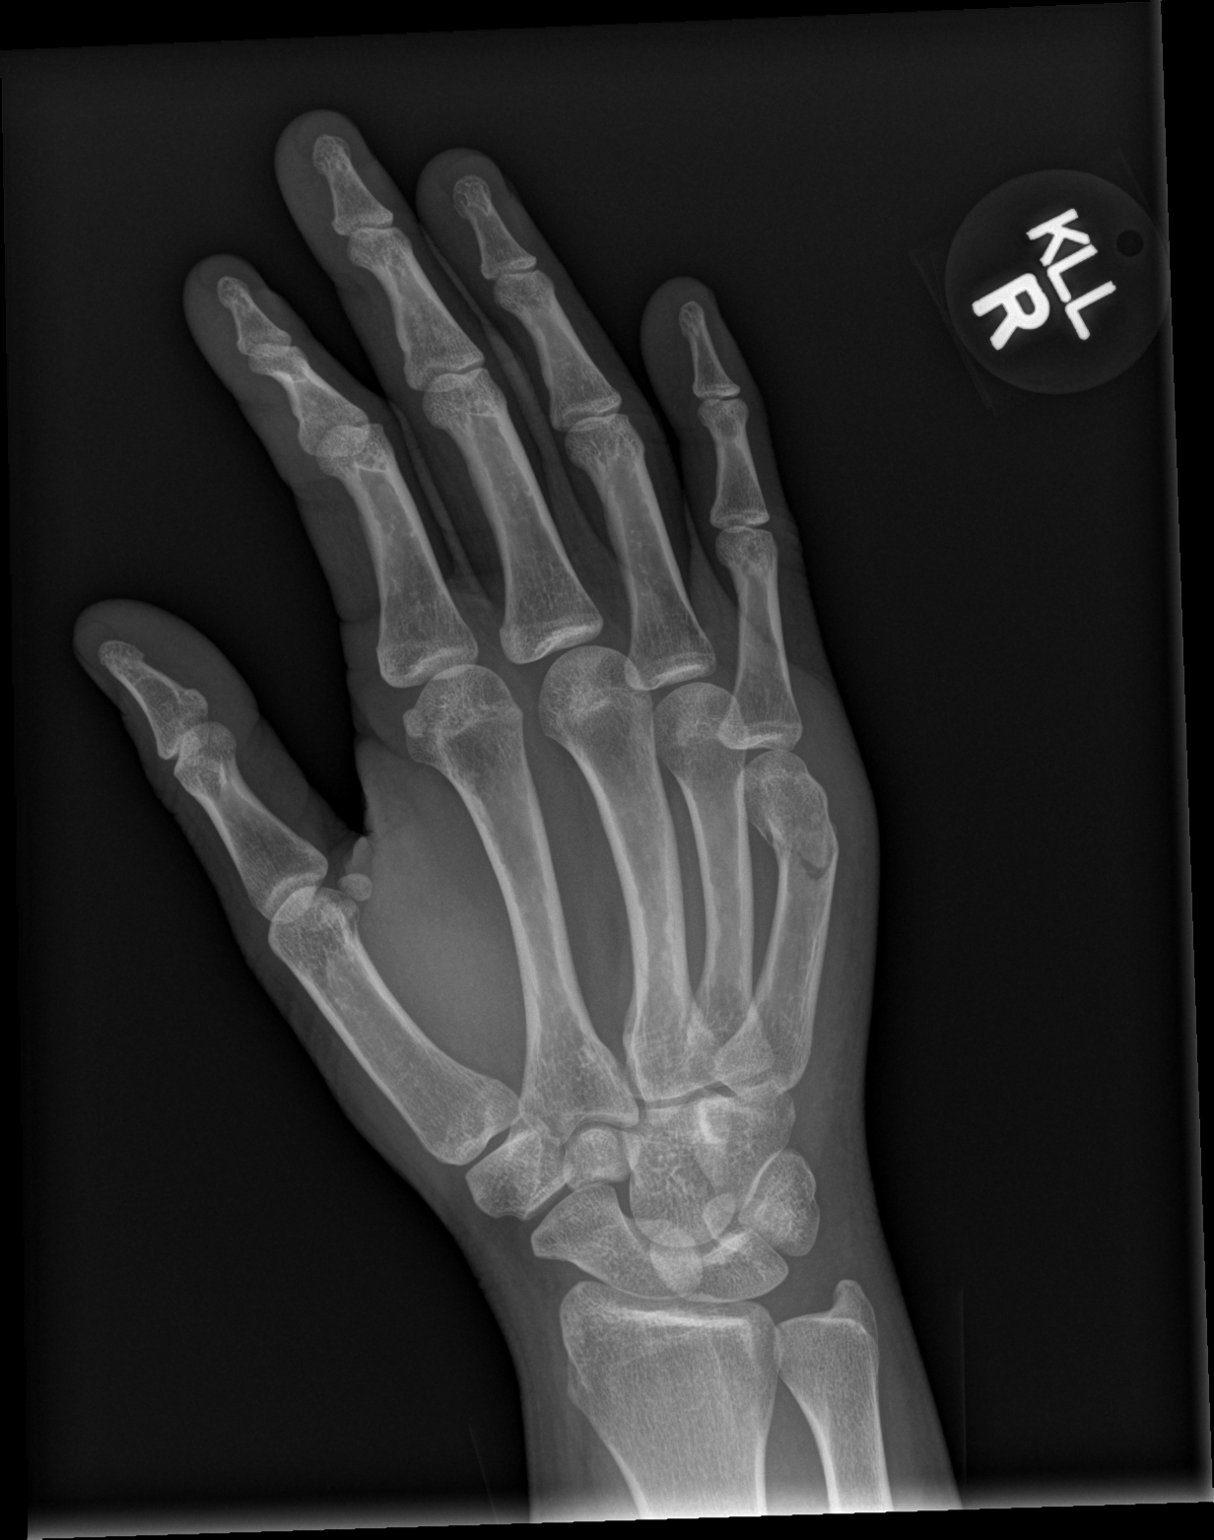

[x hand lat right]
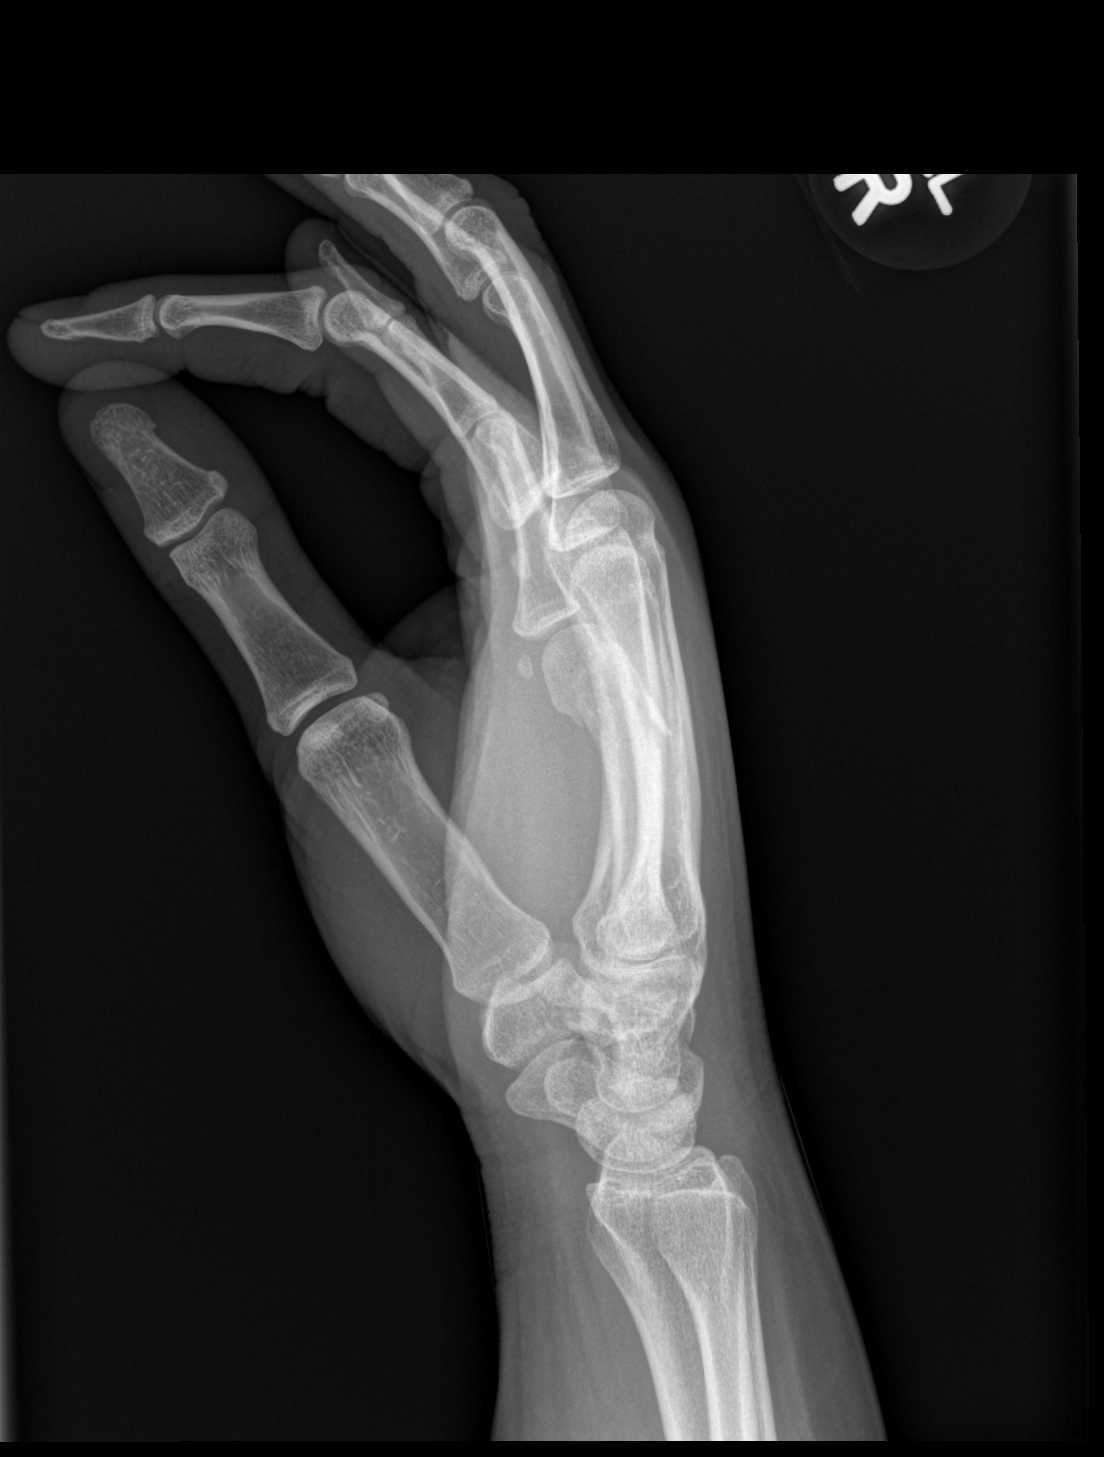

[3 of 3 positions shown; findings below may reference images not displayed]

FINDINGS: Acute fracture distal shaft of the fifth metacarpal with mild volar
angulation of distal fracture fragment. No subluxation. No
radiopaque foreign body.
IMPRESSION: Acute mildly angulated distal fifth metacarpal fracture

## 2021-03-03 ENCOUNTER — Ambulatory Visit (HOSPITAL_COMMUNITY): Payer: Medicaid Other

## 2021-03-04 ENCOUNTER — Ambulatory Visit (HOSPITAL_COMMUNITY)
Admission: RE | Admit: 2021-03-04 | Discharge: 2021-03-04 | Disposition: A | Discharge status: Home / Self Care | Payer: Self-pay | Source: Ambulatory Visit | Attending: Physician Assistant | Admitting: Physician Assistant

## 2021-03-04 ENCOUNTER — Other Ambulatory Visit: Payer: Self-pay

## 2021-03-04 ENCOUNTER — Encounter (HOSPITAL_COMMUNITY): Payer: Self-pay

## 2021-03-04 VITALS — BP 105/54 | HR 68 | Temp 97.7°F | Resp 19

## 2021-03-04 DIAGNOSIS — H6123 Impacted cerumen, bilateral: Secondary | ICD-10-CM

## 2021-03-04 DIAGNOSIS — H938X2 Other specified disorders of left ear: Secondary | ICD-10-CM

## 2021-03-04 NOTE — Discharge Instructions (Signed)
Ears improved with in office irrigation.  Use ceruminolytic drops such as Debrox daily to help manage symptoms.  Follow-up with ENT if symptoms persist.

## 2021-03-04 NOTE — ED Triage Notes (Signed)
Pt reports fulness sensation and hearing decreased in the left ear x 1 week. Pt used era wax removal drops x 2 days without relief. Denies pain.

## 2021-03-04 NOTE — ED Provider Notes (Signed)
[facility]    CSN: [id number] Arrival date & time: 03/04/21  3007      History   Chief Complaint Chief Complaint  Patient presents with  . Appointment    0900  . Ear Fullness    HPI Janice Ramsey is a 30 y.o. female.   Patient presents today with a several week history of left ear fullness.  Reports decreased hearing in this ear.  She does have a history of cerumen impaction and has been using over-the-counter ceruminolytic drops without improvement of symptoms.  She denies any recent swimming, airplane travel, diving.  She denies any otalgia, otorrhea, nausea, vomiting, headache, congestion, fever.  She does use Q-tips occasionally.  Denies any regular earbud or earplug use.  Has not seen ENT recently.  Denies any recent illness.  Denies any recent antibiotic use.     Past Medical History:  Diagnosis Date  . Asthma     There are no problems to display for this patient.   History reviewed. No pertinent surgical history.  OB History   No obstetric history on file.      Home Medications    Prior to Admission medications   Medication Sig Start Date End Date Taking? Authorizing Provider  albuterol (PROVENTIL HFA;VENTOLIN HFA) 108 (90 Base) MCG/ACT inhaler Inhale 2 puffs into the lungs every 6 (six) hours as needed for wheezing or shortness of breath. 11/19/17   Enid Derry, PA-C  albuterol (PROVENTIL) (2.5 MG/3ML) 0.083% nebulizer solution Take 3 mLs (2.5 mg total) by nebulization every 6 (six) hours as needed for wheezing or shortness of breath. 11/19/17   Enid Derry, PA-C  HYDROcodone-acetaminophen (NORCO) 5-325 MG tablet Take 1 tablet by mouth every 6 (six) hours as needed for severe pain. Patient not taking: No sig reported 07/28/16   Hagler, Jami L, PA-C  sulfamethoxazole-trimethoprim (BACTRIM DS,SEPTRA DS) 800-160 MG tablet Take 1 tablet by mouth 2 (two) times daily. 08/06/17   Cuthriell, Delorise Royals, PA-C    Family History History reviewed. No  pertinent family history.  Social History Social History   Tobacco Use  . Smoking status: Current Every Day Smoker  . Smokeless tobacco: Never Used  Substance Use Topics  . Alcohol use: No  . Drug use: No     Allergies   Patient has no known allergies.   Review of Systems Review of Systems  Constitutional: Negative for activity change, appetite change, fatigue and fever.  HENT: Positive for hearing loss. Negative for congestion, ear discharge, ear pain, sinus pressure, sneezing and sore throat.   Respiratory: Negative for cough and shortness of breath.   Cardiovascular: Negative for chest pain.  Gastrointestinal: Negative for abdominal pain, diarrhea, nausea and vomiting.  Neurological: Negative for dizziness, light-headedness and headaches.     Physical Exam Triage Vital Signs ED Triage Vitals  Enc Vitals Group     BP 03/04/21 0907 (!) 105/54     Pulse Rate 03/04/21 0907 68     Resp 03/04/21 0907 19     Temp 03/04/21 0907 97.7 F (36.5 C)     Temp Source 03/04/21 0907 Oral     SpO2 03/04/21 0907 100 %     Weight --      Height --      Head Circumference --      Peak Flow --      Pain Score 03/04/21 0906 0     Pain Loc --      Pain Edu? --  Excl. in GC? --    No data found.  Updated Vital Signs BP (!) 105/54 (BP Location: Right Arm)   Pulse 68   Temp 97.7 F (36.5 C) (Oral)   Resp 19   LMP  (Within Weeks) Comment: 2 weeks  SpO2 100%   Visual Acuity Right Eye Distance:   Left Eye Distance:   Bilateral Distance:    Right Eye Near:   Left Eye Near:    Bilateral Near:     Physical Exam Vitals reviewed.  Constitutional:      General: She is awake. She is not in acute distress.    Appearance: Normal appearance. She is not ill-appearing.     Comments: Very pleasant female appears at age in no acute distress  HENT:     Head: Normocephalic and atraumatic.     Right Ear: Tympanic membrane, ear canal and external ear normal. There is impacted  cerumen.     Left Ear: Tympanic membrane, ear canal and external ear normal. There is impacted cerumen.     Nose:     Right Sinus: No maxillary sinus tenderness or frontal sinus tenderness.     Left Sinus: No maxillary sinus tenderness or frontal sinus tenderness.     Mouth/Throat:     Pharynx: Uvula midline. No oropharyngeal exudate or posterior oropharyngeal erythema.  Cardiovascular:     Rate and Rhythm: Normal rate and regular rhythm.     Heart sounds: No murmur heard.   Pulmonary:     Effort: Pulmonary effort is normal.     Breath sounds: Normal breath sounds. No wheezing, rhonchi or rales.     Comments: Clear to auscultation bilaterally Lymphadenopathy:     Head:     Right side of head: No submental, submandibular or tonsillar adenopathy.     Left side of head: No submental, submandibular or tonsillar adenopathy.     Cervical: No cervical adenopathy.  Psychiatric:        Behavior: Behavior is cooperative.      UC Treatments / Results  Labs (all labs ordered are listed, but only abnormal results are displayed) Labs Reviewed - No data to display  EKG   Radiology No results found.  Procedures Procedures (including critical care time)  Medications Ordered in UC Medications - No data to display  Initial Impression / Assessment and Plan / UC Course  I have reviewed the triage vital signs and the nursing notes.  Pertinent labs & imaging results that were available during my care of the patient were reviewed by me and considered in my medical decision making (see chart for details).     Symptoms improved with an office irrigation.  Encourage patient to continue using ceruminolytic drops such as Debrox given history of cerumen impaction.  She was given ENT information to follow-up should symptoms recur.  Discouraged use of earplugs, earbuds, Q-tips.  Strict return precautions given to which patient expressed understanding.  Final Clinical Impressions(s) / UC  Diagnoses   Final diagnoses:  Bilateral impacted cerumen  Ear fullness, left     Discharge Instructions     Ears improved with in office irrigation.  Use ceruminolytic drops such as Debrox daily to help manage symptoms.  Follow-up with ENT if symptoms persist.    ED Prescriptions    None     PDMP not reviewed this encounter.   Jeani Hawking, PA-C 03/04/21 1004

## 2022-06-20 ENCOUNTER — Emergency Department (HOSPITAL_COMMUNITY)
Admission: EM | Admit: 2022-06-20 | Discharge: 2022-06-20 | Payer: Medicaid Other | Attending: Emergency Medicine | Admitting: Emergency Medicine

## 2022-06-20 ENCOUNTER — Emergency Department (HOSPITAL_COMMUNITY): Payer: Medicaid Other

## 2022-06-20 ENCOUNTER — Other Ambulatory Visit: Payer: Self-pay

## 2022-06-20 ENCOUNTER — Encounter (HOSPITAL_COMMUNITY): Payer: Self-pay

## 2022-06-20 DIAGNOSIS — Z7951 Long term (current) use of inhaled steroids: Secondary | ICD-10-CM | POA: Insufficient documentation

## 2022-06-20 DIAGNOSIS — S71112A Laceration without foreign body, left thigh, initial encounter: Secondary | ICD-10-CM | POA: Insufficient documentation

## 2022-06-20 DIAGNOSIS — W540XXA Bitten by dog, initial encounter: Secondary | ICD-10-CM | POA: Insufficient documentation

## 2022-06-20 DIAGNOSIS — J45909 Unspecified asthma, uncomplicated: Secondary | ICD-10-CM | POA: Insufficient documentation

## 2022-06-20 DIAGNOSIS — Z23 Encounter for immunization: Secondary | ICD-10-CM | POA: Insufficient documentation

## 2022-06-20 DIAGNOSIS — F17200 Nicotine dependence, unspecified, uncomplicated: Secondary | ICD-10-CM | POA: Insufficient documentation

## 2022-06-20 MED ORDER — TETANUS-DIPHTH-ACELL PERTUSSIS 5-2.5-18.5 LF-MCG/0.5 IM SUSY
0.5000 mL | PREFILLED_SYRINGE | Freq: Once | INTRAMUSCULAR | Status: AC
  Administered 2022-06-20: 0.5 mL via INTRAMUSCULAR
  Filled 2022-06-20: qty 0.5

## 2022-06-20 MED ORDER — AMOXICILLIN-POT CLAVULANATE 875-125 MG PO TABS: 1.0000 | ORAL_TABLET | Freq: Two times a day (BID) | ORAL | 0 refills | Status: DC

## 2022-06-20 MED ORDER — AMOXICILLIN-POT CLAVULANATE 875-125 MG PO TABS
1.0000 | ORAL_TABLET | Freq: Once | ORAL | Status: AC
  Administered 2022-06-20: 1 via ORAL
  Filled 2022-06-20: qty 1

## 2022-06-20 MED ORDER — HYDROCODONE-ACETAMINOPHEN 5-325 MG PO TABS
2.0000 | ORAL_TABLET | Freq: Once | ORAL | Status: AC
  Administered 2022-06-20: 2 via ORAL
  Filled 2022-06-20: qty 2

## 2022-06-20 MED ORDER — LIDOCAINE HCL (PF) 1 % IJ SOLN
30.0000 mL | Freq: Once | INTRAMUSCULAR | Status: AC
  Administered 2022-06-20: 30 mL
  Filled 2022-06-20: qty 30

## 2022-06-20 NOTE — Discharge Instructions (Addendum)
Please keep a very close eye on your lacerations.  Because it was caused by a dog, and you have a very high chance of getting an infection from these wounds.  We washed them out, and gave you antibiotics in the emergency department.  You need to take Augmentin (an antibiotic) twice a day for 7 days.  Please get your staples removed in around 7 to 10 days.  Please keep a very close eye on your wounds.  If you develop any redness, increasing pain, drainage of pus, fevers, or any other concerning symptoms, please return immediately to the emergency department for recheck.

## 2022-06-20 NOTE — ED Notes (Signed)
Pt has a large wound on inside of the L thigh with fatty tissue exposed. Several other wide wounds on L leg.

## 2022-06-20 NOTE — ED Notes (Signed)
Wounds irrigated with NS. 

## 2022-06-20 NOTE — ED Triage Notes (Signed)
Pt BIB [facility]. Pt was bit by [employer] dog on left leg. Pt ambulatory without assistance.

## 2022-06-21 NOTE — ED Provider Notes (Signed)
[facility]-EMERGENCY DEPT Provider Note  CSN: [id number] Arrival date & time: 06/20/22 1428  Chief Complaint(s) Animal Bite  HPI Janice Ramsey is a 31 y.o. female presenting to the emergency department after dog bite.  Patient reports that she was bitten multiple times by a dog.  Dog is a police dog and per police, fully vaccinated.  Patient denies numbness, tingling.  Reports pain at the sites of her wounds.  The wounds are all located on the left thigh.  No nausea, vomiting.  No fevers or chills.  Denies similar episodes in the past.  Unsure of last tetanus shot.   Past Medical History Past Medical History:  Diagnosis Date   Asthma    There are no problems to display for this patient.  Home Medication(s) Prior to Admission medications   Medication Sig Start Date End Date Taking? Authorizing Provider  amoxicillin-clavulanate (AUGMENTIN) 875-125 MG tablet Take 1 tablet by mouth every 12 (twelve) hours. 06/20/22  Yes Lonell Grandchild, MD  albuterol (PROVENTIL HFA;VENTOLIN HFA) 108 (90 Base) MCG/ACT inhaler Inhale 2 puffs into the lungs every 6 (six) hours as needed for wheezing or shortness of breath. 11/19/17   Enid Derry, PA-C  albuterol (PROVENTIL) (2.5 MG/3ML) 0.083% nebulizer solution Take 3 mLs (2.5 mg total) by nebulization every 6 (six) hours as needed for wheezing or shortness of breath. 11/19/17   Enid Derry, PA-C  HYDROcodone-acetaminophen (NORCO) 5-325 MG tablet Take 1 tablet by mouth every 6 (six) hours as needed for severe pain. Patient not taking: No sig reported 07/28/16   Hagler, Jami L, PA-C  sulfamethoxazole-trimethoprim (BACTRIM DS,SEPTRA DS) 800-160 MG tablet Take 1 tablet by mouth 2 (two) times daily. 08/06/17   Cuthriell, Delorise Royals, PA-C                                                                                                                                    Past Surgical History History reviewed. No pertinent surgical  history. Family History History reviewed. No pertinent family history.  Social History Social History   Tobacco Use   Smoking status: Every Day   Smokeless tobacco: Never  Substance Use Topics   Alcohol use: No   Drug use: No   Allergies Patient has no known allergies.  Review of Systems Review of Systems  All other systems reviewed and are negative.   Physical Exam Vital Signs  I have reviewed the triage vital signs BP 108/85   Pulse 79   Temp 99 F (37.2 C) (Oral)   Resp 20   LMP 06/16/2022   SpO2 96%  Physical Exam Vitals and nursing note reviewed.  Constitutional:      General: She is not in acute distress.    Appearance: She is well-developed.  HENT:     Head: Normocephalic and atraumatic.     Mouth/Throat:     Mouth: Mucous membranes are moist.  Eyes:  Pupils: Pupils are equal, round, and reactive to light.  Cardiovascular:     Rate and Rhythm: Normal rate and regular rhythm.     Heart sounds: No murmur heard. Pulmonary:     Effort: Pulmonary effort is normal. No respiratory distress.     Breath sounds: Normal breath sounds.  Abdominal:     General: Abdomen is flat.     Palpations: Abdomen is soft.     Tenderness: There is no abdominal tenderness.  Musculoskeletal:        General: No tenderness.     Right lower leg: No edema.     Left lower leg: No edema.  Skin:    General: Skin is warm and dry.     Comments: Numerous wounds to the left thigh, including 3.5 cm wound to the left anterior thigh, 5 cm wound to the left anterior thigh, 5 cm wound to the left medial thigh, 1 cm wound to the left medial thigh, 1 cm wound to the left posterior thigh, 1 cm wound to the left posterior thigh.  These wounds have exposed fat, explored wound to wound base with no evidence of foreign body, no exposed muscle or fascia.  Patient also with scattered other superficial lacerations and abrasions on the left side.  Remainder of skin exam without sign of wound.   Neurological:     General: No focal deficit present.     Mental Status: She is alert. Mental status is at baseline.  Psychiatric:        Mood and Affect: Mood normal.        Behavior: Behavior normal.     ED Results and Treatments Labs (all labs ordered are listed, but only abnormal results are displayed) Labs Reviewed - No data to display                                                                                                                        Radiology DG Femur Min 2 Views Left  Result Date: 06/20/2022 CLINICAL DATA:  Dog bite. EXAM: LEFT FEMUR 2 VIEWS COMPARISON:  None Available. FINDINGS: Cortical margins of the femur are intact. There is no evidence of fracture or other focal bone lesions. The included soft tissues are unremarkable. Suspected soft tissue edema about the lateral distal thigh, only partially included in the field of view. No radiopaque foreign body. IMPRESSION: Soft tissue edema. No radiopaque foreign body or acute osseous abnormality. Electronically Signed   By: Narda Rutherford M.D.   On: 06/20/2022 17:18   DG Hip Unilat With Pelvis 2-3 Views Left  Result Date: 06/20/2022 CLINICAL DATA:  Dog bite EXAM: DG HIP (WITH OR WITHOUT PELVIS) 2-3V LEFT COMPARISON:  None Available. FINDINGS: No fracture or dislocation is seen. There are no opaque foreign bodies. Phleboliths are seen in pelvis. IMPRESSION: No radiographic abnormality is seen in pelvis and left hip. Electronically Signed   By: Ernie Avena M.D.   On: 06/20/2022 17:17    Pertinent  labs & imaging results that were available during my care of the patient were reviewed by me and considered in my medical decision making (see MDM for details).  Medications Ordered in ED Medications  HYDROcodone-acetaminophen (NORCO/VICODIN) 5-325 MG per tablet 2 tablet (2 tablets Oral Given 06/20/22 1555)  Tdap (BOOSTRIX) injection 0.5 mL (0.5 mLs Intramuscular Given 06/20/22 1616)  lidocaine (PF) (XYLOCAINE) 1 %  injection 30 mL (30 mLs Infiltration Given by Other 06/20/22 1908)  amoxicillin-clavulanate (AUGMENTIN) 875-125 MG per tablet 1 tablet (1 tablet Oral Given 06/20/22 1555)                                                                                                                                     Procedures .Marland KitchenLaceration Repair  Date/Time: 06/21/2022 11:25 AM  Performed by: Lonell Grandchild, MD Authorized by: Lonell Grandchild, MD   Consent:    Consent obtained:  Verbal   Consent given by:  Patient   Risks, benefits, and alternatives were discussed: yes     Risks discussed:  Infection, need for additional repair, nerve damage, poor wound healing, poor cosmetic result, pain, retained foreign body, tendon damage and vascular damage   Alternatives discussed:  No treatment, observation, delayed treatment and referral Universal protocol:    Procedure explained and questions answered to patient or proxy's satisfaction: yes     Patient identity confirmed:  Verbally with patient and arm band Anesthesia:    Anesthesia method:  Local infiltration   Local anesthetic:  Lidocaine 1% w/o epi Laceration details:    Location:  Leg   Leg location:  L upper leg (anterior thigh)   Length (cm):  3.5 Exploration:    Limited defect created (wound extended): no     Hemostasis achieved with:  Direct pressure   Imaging outcome: foreign body not noted     Wound exploration: wound explored through full range of motion and entire depth of wound visualized     Wound extent: no areolar tissue violation noted, no fascia violation noted, no foreign bodies/material noted, no muscle damage noted, no nerve damage noted, no tendon damage noted, no underlying fracture noted and no vascular damage noted     Contaminated: no   Treatment:    Area cleansed with:  Saline   Amount of cleaning:  Extensive   Irrigation solution:  Sterile saline   Irrigation method:  Syringe   Visualized foreign bodies/material removed:  no     Debridement:  None   Undermining:  None   Scar revision: no   Skin repair:    Repair method:  Staples   Number of staples:  5 Approximation:    Approximation:  Close Repair type:    Repair type:  Simple Post-procedure details:    Procedure completion:  Tolerated well, no immediate complications .Marland KitchenLaceration Repair  Date/Time: 06/21/2022 11:27 AM  Performed by: Lonell Grandchild, MD Authorized by: Alvino Blood  L, MD   Consent:    Consent obtained:  Verbal   Consent given by:  Patient   Risks, benefits, and alternatives were discussed: yes     Risks discussed:  Infection, need for additional repair, nerve damage, poor wound healing, poor cosmetic result, retained foreign body, tendon damage, pain and vascular damage   Alternatives discussed:  No treatment, delayed treatment, referral and observation Universal protocol:    Procedure explained and questions answered to patient or proxy's satisfaction: yes     Patient identity confirmed:  Verbally with patient and arm band Anesthesia:    Anesthesia method:  Local infiltration   Local anesthetic:  Lidocaine 1% w/o epi Laceration details:    Location:  Leg   Leg location:  L upper leg (L anterior thigh)   Wound length (cm): 5. Pre-procedure details:    Preparation:  Imaging obtained to evaluate for foreign bodies Exploration:    Limited defect created (wound extended): no     Imaging obtained: x-ray     Imaging outcome: foreign body not noted     Wound exploration: wound explored through full range of motion and entire depth of wound visualized     Wound extent: no areolar tissue violation noted, no fascia violation noted, no foreign bodies/material noted, no muscle damage noted, no nerve damage noted, no tendon damage noted, no underlying fracture noted and no vascular damage noted     Contaminated: no   Treatment:    Area cleansed with:  Saline   Amount of cleaning:  Standard   Irrigation method:  Syringe    Visualized foreign bodies/material removed: no     Debridement:  None   Scar revision: no   Skin repair:    Repair method:  Staples   Number of staples:  5 Approximation:    Approximation:  Close Repair type:    Repair type:  Simple .Marland KitchenLaceration Repair  Date/Time: 06/21/2022 11:28 AM  Performed by: Lonell Grandchild, MD Authorized by: Lonell Grandchild, MD   Consent:    Consent obtained:  Verbal   Consent given by:  Patient   Risks, benefits, and alternatives were discussed: yes     Risks discussed:  Infection, need for additional repair, nerve damage, poor wound healing, poor cosmetic result, retained foreign body, tendon damage, vascular damage and pain   Alternatives discussed:  No treatment, delayed treatment, observation and referral Universal protocol:    Procedure explained and questions answered to patient or proxy's satisfaction: yes     Patient identity confirmed:  Verbally with patient and arm band Anesthesia:    Anesthesia method:  None Laceration details:    Location:  Leg   Leg location:  L upper leg (medial thigh)   Length (cm):  5 Pre-procedure details:    Preparation:  Imaging obtained to evaluate for foreign bodies Exploration:    Limited defect created (wound extended): no     Imaging obtained: x-ray     Imaging outcome: foreign body not noted     Wound exploration: wound explored through full range of motion and entire depth of wound visualized     Wound extent: no areolar tissue violation noted, no fascia violation noted, no foreign bodies/material noted, no muscle damage noted, no nerve damage noted, no tendon damage noted, no underlying fracture noted and no vascular damage noted     Contaminated: no   Treatment:    Area cleansed with:  Saline   Amount of cleaning:  Extensive  Irrigation solution:  Sterile saline   Irrigation method:  Syringe   Visualized foreign bodies/material removed: no     Debridement:  None   Undermining:  None   Scar  revision: no   Skin repair:    Repair method:  Staples   Number of staples:  7 Approximation:    Approximation:  Close Repair type:    Repair type:  Simple Post-procedure details:    Procedure completion:  Tolerated well, no immediate complications .Marland KitchenLaceration Repair  Date/Time: 06/21/2022 11:30 AM  Performed by: Lonell Grandchild, MD Authorized by: Lonell Grandchild, MD   Consent:    Consent obtained:  Verbal   Consent given by:  Patient   Risks, benefits, and alternatives were discussed: yes     Risks discussed:  Infection, nerve damage, need for additional repair, pain, poor cosmetic result, poor wound healing, vascular damage, tendon damage and retained foreign body   Alternatives discussed:  No treatment Universal protocol:    Procedure explained and questions answered to patient or proxy's satisfaction: yes     Patient identity confirmed:  Verbally with patient and arm band Anesthesia:    Anesthesia method:  Local infiltration   Local anesthetic:  Lidocaine 1% w/o epi Laceration details:    Location:  Leg   Leg location:  L upper leg   Length (cm):  1 Pre-procedure details:    Preparation:  Imaging obtained to evaluate for foreign bodies Exploration:    Limited defect created (wound extended): no     Imaging obtained: x-ray     Imaging outcome: foreign body not noted     Wound exploration: wound explored through full range of motion and entire depth of wound visualized     Wound extent: no areolar tissue violation noted, no fascia violation noted, no foreign bodies/material noted, no muscle damage noted, no nerve damage noted, no tendon damage noted, no underlying fracture noted and no vascular damage noted     Contaminated: no   Treatment:    Area cleansed with:  Saline   Amount of cleaning:  Extensive   Irrigation solution:  Sterile saline   Irrigation method:  Syringe   Visualized foreign bodies/material removed: no     Debridement:  None   Undermining:   None   Scar revision: no   Skin repair:    Repair method:  Staples   Number of staples:  2 Approximation:    Approximation:  Close Repair type:    Repair type:  Simple Post-procedure details:    Procedure completion:  Tolerated well, no immediate complications .Marland KitchenLaceration Repair  Date/Time: 06/21/2022 11:31 AM  Performed by: Lonell Grandchild, MD Authorized by: Lonell Grandchild, MD   Consent:    Consent obtained:  Verbal   Consent given by:  Patient   Risks, benefits, and alternatives were discussed: yes     Risks discussed:  Infection, need for additional repair, nerve damage, poor wound healing, poor cosmetic result, pain, retained foreign body, tendon damage and vascular damage   Alternatives discussed:  No treatment Universal protocol:    Procedure explained and questions answered to patient or proxy's satisfaction: yes     Patient identity confirmed:  Verbally with patient and arm band Anesthesia:    Anesthesia method:  Local infiltration   Local anesthetic:  Lidocaine 1% w/o epi Laceration details:    Location:  Leg   Leg location:  L upper leg (posterior thigh)   Length (cm):  2 Pre-procedure details:  Preparation:  Imaging obtained to evaluate for foreign bodies Exploration:    Limited defect created (wound extended): no     Imaging obtained: x-ray     Imaging outcome: foreign body not noted     Wound exploration: wound explored through full range of motion and entire depth of wound visualized     Wound extent: no areolar tissue violation noted, no fascia violation noted, no foreign bodies/material noted, no muscle damage noted, no nerve damage noted, no tendon damage noted, no underlying fracture noted and no vascular damage noted     Contaminated: no   Treatment:    Area cleansed with:  Saline   Amount of cleaning:  Standard   Irrigation solution:  Sterile saline   Irrigation method:  Syringe   Visualized foreign bodies/material removed: no      Debridement:  None   Undermining:  None   Scar revision: no   Skin repair:    Repair method:  Staples   Number of staples:  2 Approximation:    Approximation:  Close Repair type:    Repair type:  Simple Post-procedure details:    Procedure completion:  Tolerated well, no immediate complications .Marland KitchenLaceration Repair  Date/Time: 06/21/2022 11:33 AM  Performed by: Lonell Grandchild, MD Authorized by: Lonell Grandchild, MD   Consent:    Consent obtained:  Verbal   Consent given by:  Patient   Risks, benefits, and alternatives were discussed: yes     Risks discussed:  Infection, need for additional repair, nerve damage, poor wound healing, poor cosmetic result, pain, retained foreign body, tendon damage and vascular damage   Alternatives discussed:  No treatment, delayed treatment, referral and observation Universal protocol:    Procedure explained and questions answered to patient or proxy's satisfaction: yes     Patient identity confirmed:  Verbally with patient and arm band Anesthesia:    Anesthesia method:  Local infiltration   Local anesthetic:  Lidocaine 1% w/o epi Laceration details:    Location:  Leg   Leg location:  L upper leg (left posterior thigh)   Length (cm):  1 Pre-procedure details:    Preparation:  Imaging obtained to evaluate for foreign bodies Exploration:    Limited defect created (wound extended): no     Imaging obtained: x-ray     Imaging outcome: foreign body not noted     Wound exploration: wound explored through full range of motion and entire depth of wound visualized     Wound extent: no areolar tissue violation noted, no fascia violation noted, no foreign bodies/material noted, no muscle damage noted, no nerve damage noted, no tendon damage noted, no underlying fracture noted and no vascular damage noted   Treatment:    Area cleansed with:  Saline   Amount of cleaning:  Extensive   Irrigation solution:  Sterile saline   Irrigation method:   Syringe   Visualized foreign bodies/material removed: no     Debridement:  None   Undermining:  None   Scar revision: no   Skin repair:    Repair method:  Staples   Number of staples:  1 Approximation:    Approximation:  Close Repair type:    Repair type:  Simple Post-procedure details:    Procedure completion:  Tolerated well, no immediate complications   (including critical care time)  Medical Decision Making / ED Course   MDM:  31 year old female presenting after dog bite injury.  X-rays obtained without evidence of foreign body.  Tetanus updated.  Pain controlled.  Repaired 6 separate lacerations.  Discussed with patient monitoring and leaving open versus repair.  Given exposed fat, patient would benefit from closure. Wounds extensively irrigated. Patient prescribed augmentin and dose given in emergency department. Discussed very strict return precautions and high risk of infection with the patient. Will discharge patient to care of police, with copy of aftercare instructions for care in jail. All questions answered. Patient comfortable with plan of discharge. Return precautions discussed with patient and specified on the after visit summary.      Additional history obtained: -Additional history obtained from police -External records from outside source obtained and reviewed including: Chart review including previous notes, labs, imaging, consultation notes   Lab Tests: -I ordered, reviewed, and interpreted labs.   The pertinent results include:   Labs Reviewed - No data to display    EKG   EKG Interpretation  Date/Time:    Ventricular Rate:    PR Interval:    QRS Duration:   QT Interval:    QTC Calculation:   R Axis:     Text Interpretation:           Imaging Studies ordered: I ordered imaging studies including XR femur/pelvis On my interpretation imaging demonstrates no foreign body I independently visualized and interpreted imaging. I agree with  the radiologist interpretation   Medicines ordered and prescription drug management: Meds ordered this encounter  Medications   HYDROcodone-acetaminophen (NORCO/VICODIN) 5-325 MG per tablet 2 tablet   Tdap (BOOSTRIX) injection 0.5 mL   lidocaine (PF) (XYLOCAINE) 1 % injection 30 mL   amoxicillin-clavulanate (AUGMENTIN) 875-125 MG per tablet 1 tablet   amoxicillin-clavulanate (AUGMENTIN) 875-125 MG tablet    Sig: Take 1 tablet by mouth every 12 (twelve) hours.    Dispense:  14 tablet    Refill:  0    -I have reviewed the patients home medicines and have made adjustments as needed    Cardiac Monitoring: The patient was maintained on a cardiac monitor.  I personally viewed and interpreted the cardiac monitored which showed an underlying rhythm of: NSR  Social Determinants of Health:  Factors impacting patients care include: obesity   Reevaluation: After the interventions noted above, I reevaluated the patient and found that they have improved  Co morbidities that complicate the patient evaluation  Past Medical History:  Diagnosis Date   Asthma       Dispostion: Discharge    Final Clinical Impression(s) / ED Diagnoses Final diagnoses:  Dog bite, initial encounter  Laceration of skin of left thigh, initial encounter     This chart was dictated using voice recognition software.  Despite best efforts to proofread,  errors can occur which can change the documentation meaning.    Lonell GrandchildScheving, Evin Loiseau L, MD 06/21/22 1139

## 2022-06-26 NOTE — ED Notes (Signed)
Opened chart to provide work note 

## 2022-06-29 ENCOUNTER — Emergency Department (HOSPITAL_COMMUNITY)
Admission: EM | Admit: 2022-06-29 | Discharge: 2022-06-29 | Disposition: A | Discharge status: Home / Self Care | Payer: Medicaid Other | Attending: Emergency Medicine | Admitting: Emergency Medicine

## 2022-06-29 ENCOUNTER — Encounter (HOSPITAL_COMMUNITY): Payer: Self-pay | Admitting: Emergency Medicine

## 2022-06-29 DIAGNOSIS — J45909 Unspecified asthma, uncomplicated: Secondary | ICD-10-CM | POA: Insufficient documentation

## 2022-06-29 DIAGNOSIS — Z4800 Encounter for change or removal of nonsurgical wound dressing: Secondary | ICD-10-CM | POA: Insufficient documentation

## 2022-06-29 DIAGNOSIS — Z5189 Encounter for other specified aftercare: Secondary | ICD-10-CM

## 2022-06-29 MED ORDER — CLINDAMYCIN HCL 150 MG PO CAPS: 450.0000 mg | ORAL_CAPSULE | Freq: Three times a day (TID) | ORAL | 0 refills | Status: AC

## 2022-06-29 NOTE — Discharge Instructions (Addendum)
You were seen in the emergency department for a wound check. We removed 2 staples to allow the wound to open and drain. I am placing you on a new antibiotic, Clindamycin that you will take every 8 hours over the next week. Please return in about 3 days for a recheck of your wound. Return sooner for fever or significantly increased pus-like drainage from the area.

## 2022-06-29 NOTE — ED Triage Notes (Signed)
Patient reports she needs sutures removed from L leg placed on 9/5. States yellow drainage from one site.

## 2022-06-29 NOTE — ED Provider Notes (Signed)
[facility]-EMERGENCY DEPT Provider Note   CSN: [id number] Arrival date & time: 06/29/22  1124     History  Chief Complaint  Patient presents with   Suture / Staple Removal    Janice Ramsey is a 31 y.o. female.  With past medical history of asthma who presents to the emergency department for wound recheck.  Patient states that she was seen on 06/20/2022 after having dog bite to the left thigh.  Imaging was negative.  She had multiple lacerations that were closed with staples.  She was placed on Augmentin and had her tetanus shot updated.  States that a few days ago she began having drainage and foul odor from the posterior left thigh wound.  She denies having fever, nausea.  States she has been taking her antibiotics as prescribed.   Suture / Staple Removal       Home Medications Prior to Admission medications   Medication Sig Start Date End Date Taking? Authorizing Provider  clindamycin (CLEOCIN) 150 MG capsule Take 3 capsules (450 mg total) by mouth 3 (three) times daily for 7 days. 06/29/22 07/06/22 Yes Cristopher Peru, PA-C  albuterol (PROVENTIL HFA;VENTOLIN HFA) 108 (90 Base) MCG/ACT inhaler Inhale 2 puffs into the lungs every 6 (six) hours as needed for wheezing or shortness of breath. 11/19/17   Enid Derry, PA-C  albuterol (PROVENTIL) (2.5 MG/3ML) 0.083% nebulizer solution Take 3 mLs (2.5 mg total) by nebulization every 6 (six) hours as needed for wheezing or shortness of breath. 11/19/17   Enid Derry, PA-C  HYDROcodone-acetaminophen (NORCO) 5-325 MG tablet Take 1 tablet by mouth every 6 (six) hours as needed for severe pain. Patient not taking: No sig reported 07/28/16   Hagler, Jami L, PA-C  sulfamethoxazole-trimethoprim (BACTRIM DS,SEPTRA DS) 800-160 MG tablet Take 1 tablet by mouth 2 (two) times daily. 08/06/17   Cuthriell, Delorise Royals, PA-C      Allergies    Patient has no known allergies.    Review of Systems   Review of Systems  Skin:   Positive for wound.  All other systems reviewed and are negative.   Physical Exam Updated Vital Signs BP 130/82   Pulse 81   Temp 98.6 F (37 C) (Oral)   Resp 18   LMP 06/16/2022   SpO2 97%  Physical Exam Vitals and nursing note reviewed.  Constitutional:      General: She is not in acute distress.    Appearance: Normal appearance. She is obese. She is not ill-appearing or toxic-appearing.  HENT:     Head: Normocephalic and atraumatic.  Eyes:     General: No scleral icterus. Pulmonary:     Effort: Pulmonary effort is normal. No respiratory distress.  Skin:    Capillary Refill: Capillary refill takes less than 2 seconds.     Findings: Erythema present. No rash.     Comments: Left posterior thigh with 2 to 3 cm wound that is stapled.  There is surrounding redness and the wound appears to have some purulent drainage.  Other wounds appear well-healing  Neurological:     General: No focal deficit present.     Mental Status: She is alert and oriented to person, place, and time.  Psychiatric:        Mood and Affect: Mood normal.        Behavior: Behavior normal.        Thought Content: Thought content normal.        Judgment: Judgment normal.  ED Results / Procedures / Treatments   Labs (all labs ordered are listed, but only abnormal results are displayed) Labs Reviewed - No data to display  EKG None  Radiology No results found.  Procedures .Suture Removal  Date/Time: 06/29/2022 5:08 PM  Performed by: Cristopher Peru, PA-C Authorized by: Cristopher Peru, PA-C   Consent:    Consent obtained:  Verbal   Consent given by:  Patient   Risks, benefits, and alternatives were discussed: yes     Risks discussed:  Bleeding, pain and wound separation   Alternatives discussed:  No treatment, delayed treatment and alternative treatment Universal protocol:    Procedure explained and questions answered to patient or proxy's satisfaction: yes     Relevant documents  present and verified: yes     Test results available: yes     Imaging studies available: yes     Required blood products, implants, devices, and special equipment available: yes     Site/side marked: yes     Immediately prior to procedure, a time out was called: yes     Patient identity confirmed:  Verbally with patient Location:    Location:  Lower extremity   Lower extremity location:  Leg   Leg location:  L upper leg Procedure details:    Wound appearance:  Purulent and red   Number of staples removed:  2 Post-procedure details:    Post-removal:  No dressing applied   Procedure completion:  Tolerated well, no immediate complications Comments:     Wound flushed with normal saline without purulence    Medications Ordered in ED Medications - No data to display  ED Course/ Medical Decision Making/ A&P                           Medical Decision Making Risk Prescription drug management.  This patient presents to the ED with chief complaint(s) of wound drainage with pertinent past medical history of asthma which further complicates the presenting complaint. The complaint involves an extensive differential diagnosis and also carries with it a high risk of complications and morbidity.    The differential diagnosis includes wound infection, seroma, wound dehiscence    Additional history obtained: Additional history obtained from  none available Records reviewed Care Everywhere/External Records and Primary Care Documents ED physician note from 06/20/22  ED Course and Reassessment: 31 year old female who presents to the emergency department for foul odor from dog bite wound on 06/20/22.   Wound was stapled after being thoroughly cleaned on 06/20/22. She was placed on Augmentin and has been taking this as prescribed. On evaluation the 3cm wound to the posterior thigh is reddened. There is no surrounding cellulitis. There is no fluctuance or induration concerning for underlying abscess.    I removed 2 of the stitches in the middle of the wound. Underneath, there is an open wound. I flushed the wound with around of normal saline without any purulent discharge. The remaining wounds appear to be well healing. Will not close the wound. It will need to have secondary wound healing. I discussed this with her. I left the remaining staples in place as they are not obstructing the open wound. Given that she is almost done with Augmentin with appearance of wound, I am placing her on Clindamycin for 7 days. I have instructed her to return in 3 days for a wound recheck to ensure no accumulation of abscess or spreading infection. She has  no systemic symptoms at this time and feel that she is otherwise safe for discharge. She is instructed to present sooner for fever, copious new purulent drainage from the area.   Independent labs interpretation:  The following labs were independently interpreted: not indicated  Independent visualization of imaging: Not indicated   Consultation: - Consulted or discussed management/test interpretation w/ external professional: not indicated   Consideration for admission or further workup: not indicated Social Determinants of health: none identified  Final Clinical Impression(s) / ED Diagnoses Final diagnoses:  Visit for wound check    Rx / DC Orders ED Discharge Orders          Ordered    clindamycin (CLEOCIN) 150 MG capsule  3 times daily        06/29/22 1640              Cristopher Peru, PA-C 06/29/22 1831    Pricilla Loveless, MD 07/07/22 [phone]

## 2022-07-07 ENCOUNTER — Other Ambulatory Visit: Payer: Self-pay

## 2022-07-07 ENCOUNTER — Emergency Department (HOSPITAL_COMMUNITY): Payer: Self-pay

## 2022-07-07 ENCOUNTER — Emergency Department (HOSPITAL_COMMUNITY)
Admission: EM | Admit: 2022-07-07 | Discharge: 2022-07-07 | Disposition: A | Discharge status: Home / Self Care | Payer: Self-pay | Attending: Emergency Medicine | Admitting: Emergency Medicine

## 2022-07-07 ENCOUNTER — Encounter (HOSPITAL_COMMUNITY): Payer: Self-pay

## 2022-07-07 DIAGNOSIS — Z4802 Encounter for removal of sutures: Secondary | ICD-10-CM | POA: Insufficient documentation

## 2022-07-07 DIAGNOSIS — Z5189 Encounter for other specified aftercare: Secondary | ICD-10-CM

## 2022-07-07 DIAGNOSIS — Z4800 Encounter for change or removal of nonsurgical wound dressing: Secondary | ICD-10-CM | POA: Insufficient documentation

## 2022-07-07 MED ORDER — CLINDAMYCIN HCL 150 MG PO CAPS: 150.0000 mg | ORAL_CAPSULE | Freq: Four times a day (QID) | ORAL | 0 refills | Status: AC

## 2022-07-07 NOTE — ED Provider Notes (Signed)
[facility] DEPT Provider Note   CSN: [id number] Arrival date & time: 07/07/22  4332     History  Chief Complaint  Patient presents with   Suture / Staple Removal    Janice Ramsey is a 31 y.o. female.  Patient presents to the emergency department complaining of the need for staple removal.  The patient was seen at [facility] due to a dog bite.  Numerous wounds were noted at that time to the left thigh including a 3 and half centimeter wound to the left anterior thigh, 5 cm wound to the left anterior thigh, 5 cm into the left medial thigh, 1 cm wound to the left medial thigh, 1 cm wound to the left posterior thigh, 1 cm wound to the left posterior thigh.  Lacerations were reportedly closed with 22 total staples.  She was placed on Augmentin and had an updated tetanus booster.  Patient states that she returned on September 14 due to having some drainage and foul odor from the posterior left thigh wound.  At that visit 2 staples were removed.  There was an open wound noticed underneath which was flushed with 100 mL of normal saline without any purulent discharge.  The rest of the wound appeared to be well-healing at that time.  It was left open for secondary wound closure.  The patient was prescribed 7 days of clindamycin at that time and instructed to return in 3 days for a wound check.  The patient states that due to financial constraints she was unable to afford the clindamycin and was unable to come to the hospital until today, over a week later.  She denies fever, nausea, vomiting at this time.  She reports minimal discharge from the wound, red in color, no reported purulence.  22 staples  HPI     Home Medications Prior to Admission medications   Medication Sig Start Date End Date Taking? Authorizing Provider  clindamycin (CLEOCIN) 150 MG capsule Take 1 capsule (150 mg total) by mouth every 6 (six) hours. 07/07/22  Yes Dorothyann Peng, PA-C  albuterol  (PROVENTIL HFA;VENTOLIN HFA) 108 (90 Base) MCG/ACT inhaler Inhale 2 puffs into the lungs every 6 (six) hours as needed for wheezing or shortness of breath. 11/19/17   Laban Emperor, PA-C  albuterol (PROVENTIL) (2.5 MG/3ML) 0.083% nebulizer solution Take 3 mLs (2.5 mg total) by nebulization every 6 (six) hours as needed for wheezing or shortness of breath. 11/19/17   Laban Emperor, PA-C  HYDROcodone-acetaminophen (NORCO) 5-325 MG tablet Take 1 tablet by mouth every 6 (six) hours as needed for severe pain. Patient not taking: No sig reported 07/28/16   Hagler, Jami L, PA-C  sulfamethoxazole-trimethoprim (BACTRIM DS,SEPTRA DS) 800-160 MG tablet Take 1 tablet by mouth 2 (two) times daily. 08/06/17   Cuthriell, Charline Bills, PA-C      Allergies    Patient has no known allergies.    Review of Systems   Review of Systems  Skin:  Positive for wound.    Physical Exam Updated Vital Signs BP 106/63 (BP Location: Left Arm)   Pulse 70   Temp 98.2 F (36.8 C) (Oral)   Resp 16   Ht 5\' 8"  (1.727 m)   Wt 115.7 kg   LMP 06/16/2022   SpO2 100%   BMI 38.77 kg/m  Physical Exam Vitals and nursing note reviewed.  Constitutional:      General: She is not in acute distress.    Appearance: She is well-developed.  HENT:     Head: Normocephalic and atraumatic.  Eyes:     Conjunctiva/sclera: Conjunctivae normal.  Cardiovascular:     Rate and Rhythm: Normal rate.  Pulmonary:     Effort: Pulmonary effort is normal. No respiratory distress.  Abdominal:     General: Abdomen is flat.  Musculoskeletal:        General: No swelling.     Cervical back: Neck supple.  Skin:    Capillary Refill: Capillary refill takes less than 2 seconds.     Comments: Posterior/medial thigh wound in image below.  Other wounds appeared well-healed.  No discharge noted from wound.  Neurological:     Mental Status: She is alert.  Psychiatric:        Mood and Affect: Mood normal.      ED Results / Procedures / Treatments    Labs (all labs ordered are listed, but only abnormal results are displayed) Labs Reviewed - No data to display  EKG None  Radiology DG Femur Min 2 Views Left  Result Date: 07/07/2022 CLINICAL DATA:  Evaluate for foreign body. Need sutures removed. History of dog bite. EXAM: LEFT FEMUR 2 VIEWS COMPARISON:  06/20/2022 FINDINGS: There is no evidence of fracture or other focal bone lesions. Mild soft tissue gas and subcutaneous reticulation at the lateral and lower thigh. IMPRESSION: Mild soft tissue swelling and small bubble of gas at the lateral and lower thigh. No opaque foreign body. Electronically Signed   By: Tiburcio Pea M.D.   On: 07/07/2022 09:36    Procedures .Suture Removal  Date/Time: 07/07/2022 9:30 AM  Performed by: Darrick Grinder, PA-C Authorized by: Darrick Grinder, PA-C   Consent:    Consent obtained:  Verbal   Consent given by:  Patient   Risks, benefits, and alternatives were discussed: yes     Risks discussed:  Bleeding, pain and wound separation Universal protocol:    Procedure explained and questions answered to patient or proxy's satisfaction: yes     Required blood products, implants, devices, and special equipment available: yes     Site/side marked: yes     Immediately prior to procedure, a time out was called: yes     Patient identity confirmed:  Verbally with patient Location:    Location:  Lower extremity   Lower extremity location:  Leg   Leg location:  L upper leg Procedure details:    Wound appearance:  No signs of infection   Number of staples removed:  20 Post-procedure details:    Procedure completion:  Tolerated well, no immediate complications     Medications Ordered in ED Medications - No data to display  ED Course/ Medical Decision Making/ A&P                           Medical Decision Making Amount and/or Complexity of Data Reviewed Radiology: ordered.   Patient presents to the hospital with a chief concern for staple  removal.  20 staples were removed in total.  I ordered and interpreted imaging including plain films of the left femur to evaluate for any foreign body.  No foreign body noted.  Mild soft tissue swelling and small bubble of gas noted at the lateral lower thigh.  I agree with the radiologist findings.   Seen in the image above the wound does appear to have some delayed healing.  I did not notice any discharge, purulent or otherwise, at this time.  No sepsis.  No necrotizing fasciitis.  This wound will require healing by secondary intention.  There are no signs of infection at this time but I do believe it would be reasonable for the patient to fill and take the clindamycin prescription as prescribed at the last appointment due to the open nature of the wound.  Plan to provide patient contact information for wound care for follow-up as needed.  The patient is unable to afford medication but thinks that family will be able to help her with antibiotic prescription today.  Patient given return precautions to include systemic symptoms and signs of infection.  Discharge home at this time.        Final Clinical Impression(s) / ED Diagnoses Final diagnoses:  Encounter for staple removal  Encounter for wound re-check    Rx / DC Orders ED Discharge Orders          Ordered    clindamycin (CLEOCIN) 150 MG capsule  Every 6 hours        07/07/22 0934              Darrick Grinder, PA-C 07/07/22 1014    Pricilla Loveless, MD 07/11/22 1611

## 2022-07-07 NOTE — Discharge Instructions (Addendum)
You were seen today for staple removal.  The open area of the wound on your left thigh will have to heal on its own over time.  I have provided contact information for a wound care center that she may reach out to if this continues to not heal.  Please take the prescribed antibiotic to help with infection coverage.  You do not show signs of acute infection at this time.  If you feel significant warmth or noticed significant redness in the area, or develop systemwide symptoms such as fever, nausea, vomiting, please return for reevaluation.

## 2022-07-07 NOTE — ED Triage Notes (Signed)
Patient states she needs sutures removed. From the left leg.

## 2024-04-26 DIAGNOSIS — S43102A Unspecified dislocation of left acromioclavicular joint, initial encounter: Secondary | ICD-10-CM | POA: Diagnosis not present

## 2024-04-26 DIAGNOSIS — S4992XA Unspecified injury of left shoulder and upper arm, initial encounter: Secondary | ICD-10-CM | POA: Diagnosis not present

## 2024-04-26 DIAGNOSIS — M25512 Pain in left shoulder: Secondary | ICD-10-CM | POA: Diagnosis not present

## 2024-05-13 DIAGNOSIS — M7582 Other shoulder lesions, left shoulder: Secondary | ICD-10-CM | POA: Diagnosis not present

## 2024-05-13 DIAGNOSIS — S4352XA Sprain of left acromioclavicular joint, initial encounter: Secondary | ICD-10-CM | POA: Diagnosis not present

## 2024-05-13 DIAGNOSIS — Z9181 History of falling: Secondary | ICD-10-CM | POA: Diagnosis not present

## 2024-05-13 DIAGNOSIS — M25512 Pain in left shoulder: Secondary | ICD-10-CM | POA: Diagnosis not present
